# Patient Record
Sex: Male | Born: 1950
Health system: Southern US, Community
[De-identification: ages and names within clinical notes are randomized; demographics above are authoritative.]

## PROBLEM LIST (undated history)

## (undated) DIAGNOSIS — E785 Hyperlipidemia, unspecified: Secondary | ICD-10-CM

## (undated) DIAGNOSIS — H9190 Unspecified hearing loss, unspecified ear: Secondary | ICD-10-CM

## (undated) DIAGNOSIS — F32A Depression, unspecified: Secondary | ICD-10-CM

## (undated) DIAGNOSIS — I219 Acute myocardial infarction, unspecified: Secondary | ICD-10-CM

## (undated) DIAGNOSIS — C801 Malignant (primary) neoplasm, unspecified: Secondary | ICD-10-CM

## (undated) DIAGNOSIS — I10 Essential (primary) hypertension: Secondary | ICD-10-CM

## (undated) DIAGNOSIS — E119 Type 2 diabetes mellitus without complications: Secondary | ICD-10-CM

## (undated) DIAGNOSIS — G4730 Sleep apnea, unspecified: Secondary | ICD-10-CM

## (undated) HISTORY — PX: KNEE ARTHROSCOPY: SUR90

## (undated) HISTORY — DX: Type 2 diabetes mellitus without complications: E11.9

## (undated) HISTORY — PX: ANKLE FRACTURE SURGERY: SHX122

## (undated) HISTORY — DX: Essential (primary) hypertension: I10

## (undated) HISTORY — PX: OTHER SURGICAL HISTORY: SHX169

## (undated) HISTORY — PX: GASTRIC BYPASS: SHX52

## (undated) HISTORY — DX: Hyperlipidemia, unspecified: E78.5

## (undated) HISTORY — DX: Malignant (primary) neoplasm, unspecified: C80.1

## (undated) HISTORY — PX: CARPAL TUNNEL RELEASE: SHX101

## (undated) HISTORY — PX: ELBOW ARTHROSCOPY: SUR87

---

## 2015-12-27 ENCOUNTER — Other Ambulatory Visit: Payer: Self-pay | Admitting: Family Medicine

## 2015-12-27 DIAGNOSIS — M79652 Pain in left thigh: Secondary | ICD-10-CM

## 2015-12-27 DIAGNOSIS — M25562 Pain in left knee: Secondary | ICD-10-CM

## 2016-01-05 ENCOUNTER — Ambulatory Visit
Admission: RE | Admit: 2016-01-05 | Discharge: 2016-01-05 | Disposition: A | Discharge status: Home / Self Care | Payer: Medicare Other | Source: Ambulatory Visit | Attending: Family Medicine | Admitting: Family Medicine

## 2016-01-05 DIAGNOSIS — M79652 Pain in left thigh: Secondary | ICD-10-CM

## 2016-01-05 DIAGNOSIS — M25562 Pain in left knee: Secondary | ICD-10-CM

## 2019-09-29 LAB — BASIC METABOLIC PANEL
BUN: 14 (ref 4–21)
Creatinine: 0.8 (ref 0.6–1.3)

## 2019-09-29 LAB — MICROALBUMIN, URINE: Microalb, Ur: 3.6

## 2019-09-29 LAB — HEMOGLOBIN A1C: Hemoglobin A1C: 10.9

## 2019-09-30 LAB — LIPID PANEL
Cholesterol: 206 — AB (ref 0–200)
HDL: 40 (ref 35–70)
LDL Cholesterol: 126
Triglycerides: 198 — AB (ref 40–160)

## 2019-09-30 LAB — HEMOGLOBIN A1C: Hemoglobin A1C: 10.9

## 2020-01-04 ENCOUNTER — Other Ambulatory Visit: Payer: Self-pay

## 2020-01-04 ENCOUNTER — Encounter: Payer: Self-pay | Admitting: "Endocrinology

## 2020-01-04 ENCOUNTER — Ambulatory Visit: Payer: Medicare PPO | Admitting: "Endocrinology

## 2020-01-04 VITALS — BP 150/88 | HR 56 | Ht 73.0 in | Wt 239.6 lb

## 2020-01-04 DIAGNOSIS — E782 Mixed hyperlipidemia: Secondary | ICD-10-CM

## 2020-01-04 DIAGNOSIS — E1165 Type 2 diabetes mellitus with hyperglycemia: Secondary | ICD-10-CM | POA: Diagnosis not present

## 2020-01-04 DIAGNOSIS — I10 Essential (primary) hypertension: Secondary | ICD-10-CM

## 2020-01-04 LAB — POCT GLYCOSYLATED HEMOGLOBIN (HGB A1C): Hemoglobin A1C: 8.4 % — AB (ref 4.0–5.6)

## 2020-01-04 MED ORDER — RELION BLOOD GLUCOSE TEST VI STRP: ORAL_STRIP | 2 refills | Status: AC

## 2020-01-04 MED ORDER — INSULIN NPH ISOPHANE & REGULAR (70-30) 100 UNIT/ML ~~LOC~~ SUSP: 20.0000 [IU] | Freq: Two times a day (BID) | SUBCUTANEOUS | 1 refills | Status: AC

## 2020-01-04 MED ORDER — METFORMIN HCL ER 500 MG PO TB24: 500.0000 mg | ORAL_TABLET | Freq: Every day | ORAL | 1 refills | Status: DC

## 2020-01-04 MED ORDER — ATORVASTATIN CALCIUM 20 MG PO TABS: 20.0000 mg | ORAL_TABLET | Freq: Every day | ORAL | 1 refills | Status: DC

## 2020-01-04 NOTE — Progress Notes (Signed)
Endocrinology Consult Note       01/04/2020, 4:47 PM   Subjective:    Patient ID: Brian Carpenter, male    DOB: April 02, 1951.  Brian Carpenter is being seen in consultation for management of currently uncontrolled symptomatic diabetes requested by  Emelda Fear, DO.   Past Medical History:  Diagnosis Date  . Cancer ([place])   . Diabetes mellitus, type II ([place])   . Hyperlipidemia   . Hypertension     Past Surgical History:  Procedure Laterality Date  . CARPAL TUNNEL RELEASE    . ELBOW ARTHROSCOPY    . fibrous sarcoma    . GASTRIC BYPASS    . KNEE ARTHROSCOPY      Social History   Socioeconomic History  . Marital status: Married    Spouse name: Not on file  . Number of children: Not on file  . Years of education: Not on file  . Highest education level: Not on file  Occupational History  . Not on file  Tobacco Use  . Smoking status: Never Smoker  Vaping Use  . Vaping Use: Never used  Substance and Sexual Activity  . Alcohol use: Yes  . Drug use: Never  . Sexual activity: Not on file  Other Topics Concern  . Not on file  Social History Narrative  . Not on file   Social Determinants of Health   Financial Resource Strain:   . Difficulty of Paying Living Expenses:   Food Insecurity:   . Worried About Charity fundraiser in the Last Year:   . Arboriculturist in the Last Year:   Transportation Needs:   . Film/video editor (Medical):   Marland Kitchen Lack of Transportation (Non-Medical):   Physical Activity:   . Days of Exercise per Week:   . Minutes of Exercise per Session:   Stress:   . Feeling of Stress :   Social Connections:   . Frequency of Communication with Friends and Family:   . Frequency of Social Gatherings with Friends and Family:   . Attends Religious Services:   . Active Member of Clubs or Organizations:   . Attends Archivist Meetings:   Marland Kitchen Marital Status:      Family History  Problem Relation Age of Onset  . Diabetes Mother   . Hypertension Mother   . Hyperlipidemia Mother   . Cancer Mother   . Cancer Father     Outpatient Encounter Medications as of 01/04/2020  Medication Sig  . Cholecalciferol (VITAMIN D3) 50 MCG (2000 UT) TABS Take 1 tablet by mouth daily.  . Dulaglutide (TRULICITY) 1.5 [place]/7.0WC SOPN Inject 0.5 mLs into the skin once a week.  . insulin NPH-regular Human (70-30) 100 UNIT/ML injection Inject 20 Units into the skin 2 (two) times daily.  . Multiple Vitamins-Minerals (MENS MULTIVITAMIN PO) Take 1 tablet by mouth daily.  . [DISCONTINUED] insulin glargine (LANTUS SOLOSTAR) 100 UNIT/ML Solostar Pen Inject 20 Units into the skin daily.  . [DISCONTINUED] insulin NPH-regular Human (70-30) 100 UNIT/ML injection Inject 20 Units into the skin 2 (two) times daily.  Marland Kitchen atorvastatin (LIPITOR) 20 MG tablet  Take 1 tablet (20 mg total) by mouth daily.  . cetirizine (ZYRTEC) 10 MG tablet Take 1 tablet by mouth daily.  Marland Kitchen glucose blood (RELION GLUCOSE TEST STRIPS) test strip Use as instructed  . lisinopril (ZESTRIL) 20 MG tablet Take 1 tablet by mouth daily.  . metFORMIN (GLUCOPHAGE XR) 500 MG 24 hr tablet Take 1 tablet (500 mg total) by mouth daily with breakfast.  . propranolol (INDERAL) 40 MG tablet Take 1 tablet by mouth daily.  . sertraline (ZOLOFT) 100 MG tablet Take 1 tablet by mouth daily.  . traZODone (DESYREL) 50 MG tablet Take 1 tablet by mouth at bedtime.   No facility-administered encounter medications on file as of 01/04/2020.    ALLERGIES: No Known Allergies  VACCINATION STATUS:  There is no immunization history on file for this patient.  Diabetes He presents for his initial diabetic visit. He has type 2 diabetes mellitus. Onset time: He was diagnosed at approximate age of 86 years. His disease course has been improving. There are no hypoglycemic associated symptoms. Pertinent negatives for hypoglycemia include no  confusion, headaches, pallor or seizures. Associated symptoms include polydipsia and polyuria. Pertinent negatives for diabetes include no chest pain, no fatigue, no polyphagia and no weakness. There are no hypoglycemic complications. Symptoms are improving. Diabetic complications include peripheral neuropathy and PVD. Risk factors for coronary artery disease include diabetes mellitus, dyslipidemia, male sex, sedentary lifestyle, family history and obesity. Current diabetic treatments: Is currently on Novolin 70/30 20 units twice daily, His weight is fluctuating minimally (He has medical history of morbid obesity, weight up to 312 pounds before it gastric bypass in May 2018.). He is following a generally unhealthy diet. When asked about meal planning, he reported none. He has not had a previous visit with a dietitian. He participates in exercise intermittently. His home blood glucose trend is fluctuating minimally. (He did not bring any logs nor meter to review.  His point-of-care A1c was 8.4%, improving from 10.9%.  He denies hypoglycemia.) An ACE inhibitor/angiotensin II receptor blocker is being taken. He does not see a podiatrist.Eye exam is current.  Hyperlipidemia This is a chronic problem. The problem is uncontrolled. Exacerbating diseases include diabetes and obesity. He has no history of chronic renal disease. Pertinent negatives include no chest pain, myalgias or shortness of breath. Risk factors for coronary artery disease include diabetes mellitus, dyslipidemia, hypertension, male sex, a sedentary lifestyle, obesity and family history.  Hypertension This is a chronic problem. Pertinent negatives include no chest pain, headaches, neck pain, palpitations or shortness of breath. Risk factors for coronary artery disease include dyslipidemia, diabetes mellitus, male gender, obesity, family history and sedentary lifestyle. Hypertensive end-organ damage includes PVD. There is no history of chronic renal  disease.     Review of Systems  Constitutional: Negative for chills, fatigue, fever and unexpected weight change.  HENT: Negative for dental problem, mouth sores and trouble swallowing.   Eyes: Negative for visual disturbance.  Respiratory: Negative for cough, choking, chest tightness, shortness of breath and wheezing.   Cardiovascular: Negative for chest pain, palpitations and leg swelling.  Gastrointestinal: Negative for abdominal distention, abdominal pain, constipation, diarrhea, nausea and vomiting.  Endocrine: Positive for polydipsia and polyuria. Negative for polyphagia.  Genitourinary: Negative for dysuria, flank pain, hematuria and urgency.  Musculoskeletal: Negative for back pain, gait problem, myalgias and neck pain.  Skin: Negative for pallor, rash and wound.  Neurological: Negative for seizures, syncope, weakness, numbness and headaches.  Psychiatric/Behavioral: Negative for confusion and dysphoric mood.  Objective:    Vitals with BMI 01/04/2020  Height 6\' 1"   Weight 239 lbs 10 oz  BMI 58.85  Systolic 027  Diastolic 88  Pulse 56    BP (!) 150/88   Pulse (!) 56   Ht 6\' 1"  (1.854 m)   Wt 239 lb 9.6 oz (108.7 kg)   BMI 31.61 kg/m   Wt Readings from Last 3 Encounters:  01/04/20 239 lb 9.6 oz (108.7 kg)     Physical Exam Constitutional:      General: He is not in acute distress.    Appearance: He is well-developed.  HENT:     Head: Normocephalic and atraumatic.  Neck:     Thyroid: No thyromegaly.     Trachea: No tracheal deviation.  Cardiovascular:     Rate and Rhythm: Normal rate.     Pulses:          Dorsalis pedis pulses are 1+ on the right side and 1+ on the left side.       Posterior tibial pulses are 1+ on the right side and 1+ on the left side.     Heart sounds: Normal heart sounds, S1 normal and S2 normal. No murmur heard.  No gallop.   Pulmonary:     Effort: Pulmonary effort is normal. No respiratory distress.     Breath sounds: No wheezing.   Abdominal:     General: There is no distension.     Tenderness: There is no abdominal tenderness. There is no guarding.  Musculoskeletal:     Right shoulder: No swelling or deformity.     Cervical back: Normal range of motion and neck supple.     Comments: Bilateral flat feet, diminished monofilament test, diminished dorsalis pedis and posterior tibial arterial pulses.  Skin:    General: Skin is warm and dry.     Findings: No rash.     Nails: There is no clubbing.  Neurological:     Mental Status: He is alert and oriented to person, place, and time.     Cranial Nerves: No cranial nerve deficit.     Sensory: No sensory deficit.     Gait: Gait normal.     Deep Tendon Reflexes: Reflexes are normal and symmetric.  Psychiatric:        Speech: Speech normal.        Behavior: Behavior normal. Behavior is cooperative.        Thought Content: Thought content normal.        Judgment: Judgment normal.       Diabetic Labs (most recent): Lab Results  Component Value Date   HGBA1C 8.4 (A) 01/04/2020   HGBA1C 10.9 09/30/2019   HGBA1C 10.9 09/29/2019     Lipid Panel ( most recent) Lipid Panel     Component Value Date/Time   CHOL 206 (A) 09/30/2019 0000   TRIG 198 (A) 09/30/2019 0000   HDL 40 09/30/2019 0000   LDLCALC 126 09/30/2019 0000   CMP Latest Ref Rng & Units 09/29/2019  BUN 4 - 21 14  Creatinine 0.6 - 1.3 0.8      Assessment & Plan:   1. Type 2 diabetes mellitus with hyperglycemia, without long-term current use of insulin (HCC)  - Brian Carpenter has currently uncontrolled symptomatic type 2 DM since  69 years of age,  with most recent A1c of 8.4 %.  This is an improvement from 10.9% in April 2021.  Recent labs reviewed. - I had a long discussion  with him about the progressive nature of diabetes and the pathology behind its complications. -his diabetes is complicated by peripheral arterial disease, peripheral neuropathy, obesity/sedentary life and he remains at a  high risk for more acute and chronic complications which include CAD, CVA, CKD, retinopathy, and neuropathy. These are all discussed in detail with him.  - I have counseled him on diet  and weight management  by adopting a carbohydrate restricted/protein rich diet. Patient is encouraged to switch to  unprocessed or minimally processed     complex starch and increased protein intake (animal or plant source), fruits, and vegetables. -  he is advised to stick to a routine mealtimes to eat 3 meals  a day and avoid unnecessary snacks ( to snack only to correct hypoglycemia).   - he admits that there is a room for improvement in his food and drink choices. - Suggestion is made for him to avoid simple carbohydrates  from his diet including Cakes, Sweet Desserts, Ice Cream, Soda (diet and regular), Sweet Tea, Candies, Chips, Cookies, Store Bought Juices, Alcohol in Excess of  1-2 drinks a day, Artificial Sweeteners,  Coffee Creamer, and "Sugar-free" Products. This will help patient to have more stable blood glucose profile and potentially avoid unintended weight gain.  - he will be scheduled with Jearld Fenton, RDN, CDE for diabetes education.  - I have approached him with the following individualized plan to manage  his diabetes and patient agrees:   - he will continue to need insulin treatment in order for him to achieve and maintain control of diabetes to target.  Due to concerns from hospital Lantus, she would be allowed to remain on Novolin 70/30 3 with 20 units twice daily with breakfast and supper,  associated with strict monitoring of glucose 4 times a day-before meals and at bedtime. - he is warned not to take insulin without proper monitoring per orders. - Adjustment parameters are given to him for hypo and hyperglycemia in writing. - he is encouraged to call clinic for blood glucose levels less than 70 or above 300 mg /dl. -He will benefit from low-dose Metformin.  I discussed and added Metformin  500 mg ER daily at breakfast. -He will continue to benefit from Trulicity if he has access and proper coverage-0.5 mg subcutaneously weekly.   - Specific targets for  A1c;  LDL, HDL,  and Triglycerides were discussed with the patient.  2) Blood Pressure /Hypertension:  his blood pressure is  not controlled to target.   he is advised to continue his current medications including lisinopril 20 mg p.o. daily with breakfast . 3) Lipids/Hyperlipidemia:   Review of his recent lipid panel showed un controlled  LDL at 126 .  Will need statin treatment.  I discussed and added atorvastatin 20 mg p.o. nightly.   Side effects and precautions discussed with him.  4)  Weight/Diet:  Body mass index is 31.61 kg/m.  -   clearly complicating his diabetes care.  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 he is  a candidate for weight loss. I discussed with him the fact that loss of 5 - 10% of his  current body weight will have the most impact on his diabetes management.  Exercise, and detailed carbohydrates information provided  -  detailed on discharge instructions.  5) Chronic Care/Health Maintenance:  -he  is on ACEI/ARB and Statin medications and  is encouraged to initiate and continue to follow  up with Ophthalmology, Dentist,  Podiatrist at least yearly or according to recommendations, and advised to   stay away from smoking. I have recommended yearly flu vaccine and pneumonia vaccine at least every 5 years; moderate intensity exercise for up to 150 minutes weekly; and  sleep for at least 7 hours a day.  - he is  advised to maintain close follow up with Emelda Fear, DO for primary care needs, as well as his other providers for optimal and coordinated care.   - Time spent in this patient care: 60 min, of which > 50% was spent in  counseling  him about his currently uncontrolled type 2 diabetes, hyperlipidemia, hypertension and the rest reviewing his blood glucose  logs , discussing his hypoglycemia and hyperglycemia episodes, reviewing his current and  previous labs / studies  ( including abstraction from other facilities) and medications  doses and developing a  long term treatment plan based on the latest standards of care/ guidelines; and documenting his care.    Please refer to Patient Instructions for Blood Glucose Monitoring and Insulin/Medications Dosing Guide"  in media tab for additional information. Please  also refer to " Patient Self Inventory" in the Media  tab for reviewed elements of pertinent patient history.  Carlyon Prows participated in the discussions, expressed understanding, and voiced agreement with the above plans.  All questions were answered to his satisfaction. he is encouraged to contact clinic should he have any questions or concerns prior to his return visit.   Follow up plan: - Return in about 4 weeks (around 02/01/2020) for F/U with Meter and Logs Only - no Labs.  Glade Lloyd, MD [facility] [address] [place], [place] [address] Phone: [phone]  Fax: [phone]    01/04/2020, 4:47 PM  This note was partially dictated with voice recognition software. Similar sounding words can be transcribed inadequately or may not  be corrected upon review.

## 2020-01-04 NOTE — Patient Instructions (Signed)

## 2020-02-01 ENCOUNTER — Ambulatory Visit: Payer: Medicare PPO | Admitting: "Endocrinology

## 2020-08-08 ENCOUNTER — Inpatient Hospital Stay (HOSPITAL_COMMUNITY)
Admission: AD | Admit: 2020-08-08 | Discharge: 2020-08-15 | DRG: 311 | Disposition: A | Discharge status: Home / Self Care | Payer: Medicare PPO | Source: Other Acute Inpatient Hospital | Attending: Cardiovascular Disease | Admitting: Cardiovascular Disease

## 2020-08-08 ENCOUNTER — Inpatient Hospital Stay (HOSPITAL_COMMUNITY): Payer: Medicare PPO

## 2020-08-08 DIAGNOSIS — E876 Hypokalemia: Secondary | ICD-10-CM | POA: Diagnosis not present

## 2020-08-08 DIAGNOSIS — U071 COVID-19: Secondary | ICD-10-CM | POA: Clinically undetermined

## 2020-08-08 DIAGNOSIS — Z01810 Encounter for preprocedural cardiovascular examination: Secondary | ICD-10-CM | POA: Diagnosis not present

## 2020-08-08 DIAGNOSIS — E1165 Type 2 diabetes mellitus with hyperglycemia: Secondary | ICD-10-CM | POA: Diagnosis present

## 2020-08-08 DIAGNOSIS — I25110 Atherosclerotic heart disease of native coronary artery with unstable angina pectoris: Secondary | ICD-10-CM | POA: Diagnosis not present

## 2020-08-08 DIAGNOSIS — Z6831 Body mass index (BMI) 31.0-31.9, adult: Secondary | ICD-10-CM

## 2020-08-08 DIAGNOSIS — Z794 Long term (current) use of insulin: Secondary | ICD-10-CM | POA: Diagnosis not present

## 2020-08-08 DIAGNOSIS — I249 Acute ischemic heart disease, unspecified: Principal | ICD-10-CM | POA: Diagnosis present

## 2020-08-08 DIAGNOSIS — I255 Ischemic cardiomyopathy: Secondary | ICD-10-CM | POA: Diagnosis present

## 2020-08-08 DIAGNOSIS — Z7984 Long term (current) use of oral hypoglycemic drugs: Secondary | ICD-10-CM | POA: Diagnosis not present

## 2020-08-08 DIAGNOSIS — Z833 Family history of diabetes mellitus: Secondary | ICD-10-CM

## 2020-08-08 DIAGNOSIS — Z531 Procedure and treatment not carried out because of patient's decision for reasons of belief and group pressure: Secondary | ICD-10-CM | POA: Diagnosis present

## 2020-08-08 DIAGNOSIS — I2510 Atherosclerotic heart disease of native coronary artery without angina pectoris: Secondary | ICD-10-CM | POA: Diagnosis present

## 2020-08-08 DIAGNOSIS — Z9884 Bariatric surgery status: Secondary | ICD-10-CM

## 2020-08-08 DIAGNOSIS — Z83438 Family history of other disorder of lipoprotein metabolism and other lipidemia: Secondary | ICD-10-CM | POA: Diagnosis not present

## 2020-08-08 DIAGNOSIS — Z8249 Family history of ischemic heart disease and other diseases of the circulatory system: Secondary | ICD-10-CM

## 2020-08-08 DIAGNOSIS — I459 Conduction disorder, unspecified: Secondary | ICD-10-CM | POA: Diagnosis present

## 2020-08-08 DIAGNOSIS — Z7982 Long term (current) use of aspirin: Secondary | ICD-10-CM

## 2020-08-08 DIAGNOSIS — E782 Mixed hyperlipidemia: Secondary | ICD-10-CM | POA: Diagnosis present

## 2020-08-08 DIAGNOSIS — I10 Essential (primary) hypertension: Secondary | ICD-10-CM | POA: Diagnosis present

## 2020-08-08 DIAGNOSIS — Z79899 Other long term (current) drug therapy: Secondary | ICD-10-CM | POA: Diagnosis not present

## 2020-08-08 DIAGNOSIS — E669 Obesity, unspecified: Secondary | ICD-10-CM | POA: Diagnosis present

## 2020-08-08 HISTORY — DX: COVID-19: U07.1

## 2020-08-08 LAB — HEPARIN LEVEL (UNFRACTIONATED): Heparin Unfractionated: 0.1 IU/mL — ABNORMAL LOW (ref 0.30–0.70)

## 2020-08-08 LAB — HIV ANTIBODY (ROUTINE TESTING W REFLEX): HIV Screen 4th Generation wRfx: NONREACTIVE

## 2020-08-08 LAB — GLUCOSE, CAPILLARY: Glucose-Capillary: 227 mg/dL — ABNORMAL HIGH (ref 70–99)

## 2020-08-08 MED ORDER — HEPARIN (PORCINE) 25000 UT/250ML-% IV SOLN
1450.0000 [IU]/h | INTRAVENOUS | Status: DC
  Administered 2020-08-08: 1400 [IU]/h via INTRAVENOUS
  Administered 2020-08-12: 1550 [IU]/h via INTRAVENOUS
  Administered 2020-08-13: 1450 [IU]/h via INTRAVENOUS
  Filled 2020-08-08 (×7): qty 250

## 2020-08-08 MED ORDER — ASPIRIN EC 81 MG PO TBEC
81.0000 mg | DELAYED_RELEASE_TABLET | Freq: Every day | ORAL | Status: DC
  Administered 2020-08-09 – 2020-08-15 (×7): 81 mg via ORAL
  Filled 2020-08-08 (×7): qty 1

## 2020-08-08 MED ORDER — ATORVASTATIN CALCIUM 80 MG PO TABS
80.0000 mg | ORAL_TABLET | Freq: Every day | ORAL | Status: DC
  Administered 2020-08-08 – 2020-08-15 (×8): 80 mg via ORAL
  Filled 2020-08-08 (×8): qty 1

## 2020-08-08 MED ORDER — PROPRANOLOL HCL 40 MG PO TABS
40.0000 mg | ORAL_TABLET | Freq: Every day | ORAL | Status: DC
  Administered 2020-08-10 – 2020-08-12 (×2): 40 mg via ORAL
  Filled 2020-08-08 (×4): qty 1

## 2020-08-08 MED ORDER — ONDANSETRON HCL 4 MG/2ML IJ SOLN: 4.0000 mg | Freq: Four times a day (QID) | INTRAMUSCULAR | Status: DC | PRN

## 2020-08-08 MED ORDER — NITROGLYCERIN 0.4 MG SL SUBL: 0.4000 mg | SUBLINGUAL_TABLET | SUBLINGUAL | Status: DC | PRN

## 2020-08-08 MED ORDER — ACETAMINOPHEN 325 MG PO TABS: 650.0000 mg | ORAL_TABLET | ORAL | Status: DC | PRN

## 2020-08-08 MED ORDER — ADULT MULTIVITAMIN W/MINERALS CH
1.0000 | ORAL_TABLET | Freq: Every day | ORAL | Status: DC
  Administered 2020-08-09 – 2020-08-15 (×7): 1 via ORAL
  Filled 2020-08-08 (×7): qty 1

## 2020-08-08 MED ORDER — ASPIRIN 300 MG RE SUPP: 300.0000 mg | RECTAL | Status: AC

## 2020-08-08 MED ORDER — AMLODIPINE BESYLATE 5 MG PO TABS
5.0000 mg | ORAL_TABLET | Freq: Every day | ORAL | Status: DC
  Administered 2020-08-08 – 2020-08-12 (×5): 5 mg via ORAL
  Filled 2020-08-08 (×6): qty 1

## 2020-08-08 MED ORDER — ASPIRIN 81 MG PO CHEW
324.0000 mg | CHEWABLE_TABLET | ORAL | Status: AC
  Administered 2020-08-08: 324 mg via ORAL
  Filled 2020-08-08: qty 4

## 2020-08-08 MED ORDER — INSULIN ASPART PROT & ASPART (70-30 MIX) 100 UNIT/ML ~~LOC~~ SUSP
20.0000 [IU] | Freq: Two times a day (BID) | SUBCUTANEOUS | Status: DC
  Administered 2020-08-09 – 2020-08-13 (×10): 20 [IU] via SUBCUTANEOUS
  Filled 2020-08-08: qty 10

## 2020-08-08 MED ORDER — TRAZODONE HCL 50 MG PO TABS
50.0000 mg | ORAL_TABLET | Freq: Every day | ORAL | Status: DC
  Administered 2020-08-08 – 2020-08-14 (×7): 50 mg via ORAL
  Filled 2020-08-08 (×7): qty 1

## 2020-08-08 MED ORDER — INSULIN ASPART 100 UNIT/ML ~~LOC~~ SOLN
0.0000 [IU] | Freq: Three times a day (TID) | SUBCUTANEOUS | Status: DC
  Administered 2020-08-09: 15 [IU] via SUBCUTANEOUS
  Administered 2020-08-09: 2 [IU] via SUBCUTANEOUS
  Administered 2020-08-10: 5 [IU] via SUBCUTANEOUS
  Administered 2020-08-10 – 2020-08-11 (×4): 3 [IU] via SUBCUTANEOUS
  Administered 2020-08-12 – 2020-08-14 (×3): 2 [IU] via SUBCUTANEOUS
  Administered 2020-08-14 – 2020-08-15 (×2): 3 [IU] via SUBCUTANEOUS

## 2020-08-08 MED ORDER — SERTRALINE HCL 100 MG PO TABS
100.0000 mg | ORAL_TABLET | Freq: Every day | ORAL | Status: DC
  Administered 2020-08-08 – 2020-08-15 (×8): 100 mg via ORAL
  Filled 2020-08-08 (×8): qty 1

## 2020-08-08 MED ORDER — LISINOPRIL 10 MG PO TABS
40.0000 mg | ORAL_TABLET | Freq: Every day | ORAL | Status: DC
  Administered 2020-08-08 – 2020-08-15 (×8): 40 mg via ORAL
  Filled 2020-08-08 (×9): qty 4

## 2020-08-08 NOTE — Progress Notes (Addendum)
@  2152, Dr. Doylene Canard text-paged regarding pt's SBPs persisting in 180s-190s.  @2231  MD called after above text-page went unanswered. MD endorsed he didn't receive a page but conveyed to give a dose NOW of pt's Lisinopril but to hold Propranolol due to bradycardia. Will administer and continue to follow.

## 2020-08-08 NOTE — Progress Notes (Addendum)
ANTICOAGULATION CONSULT NOTE - Initial Consult  Pharmacy Consult for heparin Indication: chest pain/ACS  No Known Allergies  Patient Measurements: Height: 6\' 1"  (185.4 cm) Weight: 109.9 kg (242 lb 4.6 oz) IBW/kg (Calculated) : 79.9 Heparin Dosing Weight: 110kg  Vital Signs: Temp: 97.9 F (36.6 C) (03/09 1930) Temp Source: Oral (03/09 1930) BP: 187/100 (03/09 1930) Pulse Rate: 58 (03/09 1835)  Labs: No results for input(s): HGB, HCT, PLT, APTT, LABPROT, INR, HEPARINUNFRC, HEPRLOWMOCWT, CREATININE, CKTOTAL, CKMB, TROPONINIHS in the last 72 hours.  CrCl cannot be calculated (Patient's most recent lab result is older than the maximum 21 days allowed.).   Medical History: Past Medical History:  Diagnosis Date  . Cancer ([place])   . Diabetes mellitus, type II ([place])   . Hyperlipidemia   . Hypertension     Medications:  Medications Prior to Admission  Medication Sig Dispense Refill Last Dose  . atorvastatin (LIPITOR) 20 MG tablet Take 1 tablet (20 mg total) by mouth daily. 90 tablet 1   . cetirizine (ZYRTEC) 10 MG tablet Take 1 tablet by mouth daily.     . Cholecalciferol (VITAMIN D3) 50 MCG (2000 UT) TABS Take 1 tablet by mouth daily.     . Dulaglutide (TRULICITY) 1.5 GY/1.7CB SOPN Inject 0.5 mLs into the skin once a week.     Marland Kitchen glucose blood (RELION GLUCOSE TEST STRIPS) test strip Use as instructed 100 each 2   . insulin NPH-regular Human (70-30) 100 UNIT/ML injection Inject 20 Units into the skin 2 (two) times daily. 30 mL 1   . lisinopril (ZESTRIL) 20 MG tablet Take 1 tablet by mouth daily.     . metFORMIN (GLUCOPHAGE XR) 500 MG 24 hr tablet Take 1 tablet (500 mg total) by mouth daily with breakfast. 90 tablet 1   . Multiple Vitamins-Minerals (MENS MULTIVITAMIN PO) Take 1 tablet by mouth daily.     . propranolol (INDERAL) 40 MG tablet Take 1 tablet by mouth daily.     . sertraline (ZOLOFT) 100 MG tablet Take 1 tablet by mouth daily.     . traZODone (DESYREL) 50 MG tablet  Take 1 tablet by mouth at bedtime.      Scheduled:  . amLODipine  5 mg Oral Daily  . aspirin  324 mg Oral NOW   Or  . aspirin  300 mg Rectal NOW  . [START ON 08/09/2020] aspirin EC  81 mg Oral Daily  . atorvastatin  80 mg Oral Daily  . [START ON 08/09/2020] insulin aspart protamine- aspart  20 Units Subcutaneous BID WC  . lisinopril  20 mg Oral Daily  . [START ON 08/09/2020] Mens Multivitamin  1 tablet Oral Daily  . propranolol  40 mg Oral Daily  . sertraline  100 mg Oral Daily  . traZODone  50 mg Oral QHS   Infusions:    Assessment: Pt was transferred from [facility] for CP. He is s/p cath>>3VD. He was started on IV heparin there post cath around 12PM. He was transferred for CABG. Heparin has been running at 1000 units/hr.  He is not on anticoagulation prior to admission.   Goal of Therapy:  Heparin level 0.3-0.7 units/ml Monitor platelets by anticoagulation protocol: Yes   Plan:   Continue heparin 1000 units/hr Check heparin level now  Onnie Boer, PharmD, BCIDP, AAHIVP, CPP Infectious Disease Pharmacist 08/08/2020 8:28 PM  Addendum  HL came back at 0.1  Increase rate to 1400 units/hr Check level in AM  Onnie Boer, PharmD, BCIDP, AAHIVP,  CPP Infectious Disease Pharmacist 08/08/2020 8:46 PM

## 2020-08-08 NOTE — H&P (Signed)
Referring Physician: Sabra Heck, MD  Brian Carpenter is an 70 y.o. male.                       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 Patient has exertional chest pain for several months and his recent NM myocardial perfusion stress test was abnormal.   Past Medical History:  Diagnosis Date  . Cancer ([place])   . Diabetes mellitus, type II ([place])   . Hyperlipidemia   . Hypertension       Past Surgical History:  Procedure Laterality Date  . CARPAL TUNNEL RELEASE    . ELBOW ARTHROSCOPY    . fibrous sarcoma    . GASTRIC BYPASS    . KNEE ARTHROSCOPY      Family History  Problem Relation Age of Onset  . Diabetes Mother   . Hypertension Mother   . Hyperlipidemia Mother   . Cancer Mother   . Cancer Father    Social History:  reports that he has never smoked. He does not have any smokeless tobacco history on file. He reports current alcohol use. He reports that he does not use drugs.  Allergies: No Known Allergies  Medications Prior to Admission  Medication Sig Dispense Refill  . atorvastatin (LIPITOR) 20 MG tablet Take 1 tablet (20 mg total) by mouth daily. 90 tablet 1  . cetirizine (ZYRTEC) 10 MG tablet Take 1 tablet by mouth daily.    . Cholecalciferol (VITAMIN D3) 50 MCG (2000 UT) TABS Take 1 tablet by mouth daily.    . Dulaglutide (TRULICITY) 1.5 LZ/7.6BH SOPN Inject 0.5 mLs into the skin once a week.    Marland Kitchen glucose blood (RELION GLUCOSE TEST STRIPS) test strip Use as instructed 100 each 2  . insulin NPH-regular Human (70-30) 100 UNIT/ML injection Inject 20 Units into the skin 2 (two) times daily. 30 mL 1  . lisinopril (ZESTRIL) 20 MG tablet Take 1 tablet by mouth daily.    . metFORMIN (GLUCOPHAGE XR) 500 MG 24 hr tablet Take 1 tablet (500 mg  total) by mouth daily with breakfast. 90 tablet 1  . Multiple Vitamins-Minerals (MENS MULTIVITAMIN PO) Take 1 tablet by mouth daily.    . propranolol (INDERAL) 40 MG tablet Take 1 tablet by mouth daily.    . sertraline (ZOLOFT) 100 MG tablet Take 1 tablet by mouth daily.    . traZODone (DESYREL) 50 MG tablet Take 1 tablet by mouth at bedtime.      No results found for this or any previous visit (from the past 48 hour(s)). No results found.  Review Of Systems Constitutional: No fever, chills, positive chronic weight gain. Eyes: No vision change, wears glasses. No discharge or pain. Ears: No hearing loss, No tinnitus. Respiratory: No asthma, COPD, pneumonias. Positive shortness of breath. No hemoptysis. Cardiovascular: Positive chest pain, no palpitation, leg edema. Gastrointestinal: No nausea, vomiting, diarrhea, constipation. No GI bleed. No hepatitis. Genitourinary: No dysuria, hematuria, kidney stone. No incontinance. Neurological: No headache, stroke, seizures.  Psychiatry: No psych facility admission for anxiety, depression, suicide. No detox. Skin: No rash. Musculoskeletal: Positive joint pain, no fibromyalgia. No neck pain, back pain. Lymphadenopathy: No lymphadenopathy. Hematology: No anemia or easy bruising.   Blood pressure (!) 187/100, pulse (!) 58, temperature 97.9  F (36.6 C), temperature source Oral, resp. rate 17, height 6\' 1"  (1.854 m), weight 109.9 kg, SpO2 95 %. Body mass index is 31.97 kg/m. General appearance: alert, cooperative, appears stated age and no distress Head: Normocephalic, atraumatic. Eyes: Brown eyes, pink conjunctiva, corneas clear. Neck: No adenopathy, no carotid bruit, no JVD, supple, symmetrical, trachea midline and thyroid not enlarged. Resp: Clear to auscultation bilaterally. Cardio: Regular rate and rhythm, S1, S2 normal, II/VI systolic murmur, no click, rub or gallop GI: Soft, non-tender; bowel sounds normal; no organomegaly. Extremities: No  edema, cyanosis or clubbing. Stable right radial cath site. Skin: Warm and dry.  Neurologic: Alert and oriented X 3, normal strength.  Assessment/Plan Acute coronary syndrome Multivessel, native vessel CAD HTN Hyperlipidemia Type 2 DM, Insulin requiring Obesity  Review cine angiogram with CVTS and interventional cardiologist in AM. Patient has diffuse disease in all three major coronary arteries and branches. If surgery is not optimal then staged stenting of LCx followed by RCA and LAD in future as needed. Start Beta-blocker and calcium channel blocker with aspirin, IV heparin. Hold metformin and continue insulin and ace inhibitor. Antidepressant as needed.  Time spent: Review of old records, Lab, x-rays, EKG, other cardiac tests, examination, discussion with patient over 70 minutes.  Brian Riddle, MD  08/08/2020, 7:54 PM

## 2020-08-08 NOTE — Progress Notes (Signed)
Pt received from [facility]. Chg complete. Telemetry applied. Heparin gtt running at 51ml. BP 200/83 (118). Dr. Doylene Canard made aware of patients arrival  Will continue to monitor.  Clyde Canterbury, RN

## 2020-08-09 ENCOUNTER — Inpatient Hospital Stay (HOSPITAL_COMMUNITY): Payer: Medicare PPO

## 2020-08-09 ENCOUNTER — Other Ambulatory Visit (HOSPITAL_COMMUNITY): Payer: Medicare PPO

## 2020-08-09 DIAGNOSIS — I25110 Atherosclerotic heart disease of native coronary artery with unstable angina pectoris: Secondary | ICD-10-CM

## 2020-08-09 DIAGNOSIS — U071 COVID-19: Secondary | ICD-10-CM

## 2020-08-09 LAB — CBC
HCT: 38.3 % — ABNORMAL LOW (ref 39.0–52.0)
Hemoglobin: 12.8 g/dL — ABNORMAL LOW (ref 13.0–17.0)
MCH: 29 pg (ref 26.0–34.0)
MCHC: 33.4 g/dL (ref 30.0–36.0)
MCV: 86.8 fL (ref 80.0–100.0)
Platelets: 198 10*3/uL (ref 150–400)
RBC: 4.41 MIL/uL (ref 4.22–5.81)
RDW: 13.7 % (ref 11.5–15.5)
WBC: 5.8 10*3/uL (ref 4.0–10.5)
nRBC: 0 % (ref 0.0–0.2)

## 2020-08-09 LAB — GLUCOSE, CAPILLARY
Glucose-Capillary: 362 mg/dL — ABNORMAL HIGH (ref 70–99)
Glucose-Capillary: 78 mg/dL (ref 70–99)
Glucose-Capillary: 180 mg/dL — ABNORMAL HIGH (ref 70–99)
Glucose-Capillary: 122 mg/dL — ABNORMAL HIGH (ref 70–99)

## 2020-08-09 LAB — PROTIME-INR
INR: 1 (ref 0.8–1.2)
Prothrombin Time: 12.9 seconds (ref 11.4–15.2)

## 2020-08-09 LAB — BASIC METABOLIC PANEL
Anion gap: 6 (ref 5–15)
BUN: 13 mg/dL (ref 8–23)
CO2: 27 mmol/L (ref 22–32)
Calcium: 9.2 mg/dL (ref 8.9–10.3)
Chloride: 102 mmol/L (ref 98–111)
Creatinine, Ser: 1 mg/dL (ref 0.61–1.24)
GFR, Estimated: >60 mL/min (ref >60)
Glucose, Bld: 360 mg/dL — ABNORMAL HIGH (ref 70–99)
Potassium: 4.4 mmol/L (ref 3.5–5.1)
Sodium: 135 mmol/L (ref 135–145)

## 2020-08-09 LAB — LIPID PANEL
Cholesterol: 259 mg/dL — ABNORMAL HIGH (ref 0–200)
HDL: 36 mg/dL — ABNORMAL LOW (ref >40)
LDL Cholesterol: 162 mg/dL — ABNORMAL HIGH (ref 0–99)
Total CHOL/HDL Ratio: 7.2 RATIO
Triglycerides: 304 mg/dL — ABNORMAL HIGH (ref <150)
VLDL: 61 mg/dL — ABNORMAL HIGH (ref 0–40)

## 2020-08-09 LAB — SARS CORONAVIRUS 2 (TAT 6-24 HRS): SARS Coronavirus 2: POSITIVE — AB

## 2020-08-09 LAB — ECHOCARDIOGRAM COMPLETE
Area-P 1/2: 6.77 cm2
Height: 73 in
Weight: 3873.04 oz

## 2020-08-09 LAB — HEMOGLOBIN A1C
Hgb A1c MFr Bld: 12 % — ABNORMAL HIGH (ref 4.8–5.6)
Mean Plasma Glucose: 297.7 mg/dL

## 2020-08-09 LAB — HEPARIN LEVEL (UNFRACTIONATED): Heparin Unfractionated: 0.28 IU/mL — ABNORMAL LOW (ref 0.30–0.70)

## 2020-08-09 MED ORDER — HYDRALAZINE HCL 25 MG PO TABS
25.0000 mg | ORAL_TABLET | Freq: Four times a day (QID) | ORAL | Status: DC | PRN
  Administered 2020-08-09 – 2020-08-14 (×7): 25 mg via ORAL
  Filled 2020-08-09 (×8): qty 1

## 2020-08-09 MED ORDER — SODIUM CHLORIDE 0.9 % IV SOLN
100.0000 mg | Freq: Every day | INTRAVENOUS | Status: DC
  Filled 2020-08-09: qty 20

## 2020-08-09 MED ORDER — SODIUM CHLORIDE 0.9 % IV SOLN
200.0000 mg | Freq: Once | INTRAVENOUS | Status: AC
  Administered 2020-08-09: 200 mg via INTRAVENOUS
  Filled 2020-08-09: qty 40

## 2020-08-09 MED ORDER — HYDRALAZINE HCL 50 MG PO TABS
50.0000 mg | ORAL_TABLET | Freq: Once | ORAL | Status: AC
  Administered 2020-08-09: 50 mg via ORAL
  Filled 2020-08-09: qty 1

## 2020-08-09 NOTE — Progress Notes (Signed)
  Echocardiogram 2D Echocardiogram has been performed.  Merrie Roof F 08/09/2020, 1:39 PM

## 2020-08-09 NOTE — Progress Notes (Signed)
ANTICOAGULATION CONSULT NOTE  Pharmacy Consult for heparin Indication: CAD  No Known Allergies  Patient Measurements: Height: 6\' 1"  (185.4 cm) Weight: 109.9 kg (242 lb 4.6 oz) IBW/kg (Calculated) : 79.9 Heparin Dosing Weight: 110kg  Vital Signs: Temp: 98.1 F (36.7 C) (03/09 2235) Temp Source: Oral (03/09 2235) BP: 181/82 (03/10 0300) Pulse Rate: 58 (03/09 1835)  Labs: Recent Labs    08/08/20 2009 08/09/20 0128  HGB  --  12.8*  HCT  --  38.3*  PLT  --  198  LABPROT  --  12.9  INR  --  1.0  HEPARINUNFRC 0.10* 0.28*  CREATININE  --  1.00    Estimated Creatinine Clearance: 90.6 mL/min (by C-G formula based on SCr of 1 mg/dL).  Assessment: 70 y.o. male with CAD s/p cath, awaiting possible CABG, for heparin  Goal of Therapy:  Heparin level 0.3-0.7 units/ml Monitor platelets by anticoagulation protocol: Yes   Plan:  Increase Heparin 1550 units/hr  Phillis Knack, PharmD, BCPS

## 2020-08-09 NOTE — Progress Notes (Signed)
Inpatient Diabetes Program Recommendations  AACE/ADA: New Consensus Statement on Inpatient Glycemic Control  Target Ranges:  Prepandial:   less than 140 mg/dL      Peak postprandial:   less than 180 mg/dL (1-2 hours)      Critically ill patients:  140 - 180 mg/dL   Results for SADLER, TESCHNER (MRN [id number]) as of 08/09/2020 07:59  Ref. Range 08/08/2020 21:24 08/09/2020 06:20  Glucose-Capillary Latest Ref Range: 70 - 99 mg/dL 227 (H) 362 (H)  Results for DREYDEN, ROHRMAN (MRN [id number]) as of 08/09/2020 07:59  Ref. Range 01/04/2020 10:17 08/09/2020 01:28  Hemoglobin A1C Latest Ref Range: 4.8 - 5.6 % 8.4 (A) 12.0 (H)   Review of Glycemic Control  Diabetes history: DM2 Outpatient Diabetes medications: 70/30 10-20 units BID with breakfast and supper (dose based on glucose) Current orders for Inpatient glycemic control: 70/30 20 units BID, Novolog 0-15 units TID with meals  Inpatient Diabetes Program Recommendations:    HbgA1C:  A1C 12% on 08/09/20 indicating an average glucose of 297 mg/dl over the past 2-3 months.   Note: Spoke with patient over the phone about diabetes and home regimen for diabetes control. Patient reports being followed by PCP for diabetes management. Noted in chart that patient had initial visit with Dr. Dorris Fetch (Endocrinologist) on 01/04/20 but when asked about follow up patient states that he is not seeing Dr. Dorris Fetch anymore and he is just working with his PCP for DM control. Patient is currently taking 70/30 10-20 units BID with breakfast and supper as an outpatient for diabetes control. Patient reports taking DM medications as prescribed. Patient reports checking glucose 1-2 times per day and he notes his glucose had been running 95-115 mg/dl in the mornings but he had a cold about 3 weeks ago and glucose has been running much higher since that time. Patient denies any recent steroids.   Inquired about prior A1C and patient reports he had his A1C down to 7.4%. Discussed A1C results  (12% on 08/09/20) and explained that current A1C indicates an average glucose of 297 mg/dl over the past 2-3 months. Discussed glucose and A1C goals. Discussed importance of checking CBGs and maintaining good CBG control to prevent long-term and short-term complications. Explained how hyperglycemia leads to damage within blood vessels which lead to the common complications seen with uncontrolled diabetes. Stressed to the patient the importance of improving glycemic control to prevent further complications from uncontrolled diabetes. Discussed impact of nutrition, exercise, stress, sickness, and medications on diabetes control.  Inquired about potential of using continuous glucose monitoring sensor to monitor glucose more frequently and patient states that he tried to get a [employer] but his insurance would not pay for it. Encouraged patient to check glucose more often and be sure to take glucometer to appointments so provider can use the information to make adjustments.  Patient verbalized understanding of information discussed and reports no further questions at this time related to diabetes. Will continue to follow while inpatient.  Thanks, Barnie Alderman, RN, MSN, CDE Diabetes Coordinator Inpatient Diabetes Program [phone] (Team Pager)

## 2020-08-09 NOTE — Progress Notes (Signed)
@  0022, Dr. Doylene Canard notified of pt's positive COVID test. Appropriate precautions ordered and pt informed. Pt presently remains asymptomatic.

## 2020-08-09 NOTE — Consult Note (Signed)
[address]       [place] [address]             [phone]        [facility] [place] Medical Record #[id number] Date of Birth: 1950/12/25  Referring: Sabra Heck [facility]), Doylene Canard Primary Care: Emelda Fear, DO Primary Cardiologist:No primary care provider on file.  Chief Complaint:  CAD  History of Present Illness:      [name] is a 70 yo male with known history of HTN, Hyperlipidemia, Type 2 DM, H/O gastric bypass, and obesity.  The patient had been experiencing exertional chest pain over the past several months.  He had a myocardial perfusion test which was abnormal.  He underwent cardiac catheterization by Dr. Sabra Heck in [place].  This showed a mildly reduced EF of 45% and multivessel CAD.  It was felt he may benefit from coronary bypass grafting procedure and he was transferred to [facility] for further care.  Upon arrival the patient was noted to be COVID positive.  He is asymptomatic and has been vaccinated.  The patient is a Designer, television/film set.  He states that he thinks his pain likely developed back in July.  He states he was doing some work when he developed some chest discomfort.  He initially attributed this to a pulled muscle across his chest.  He states he experienced shortness of breath, decreased energy level, and fatigue as well at that time.  He states more recently the pain has become more severe.  He states his mainly experiences symptoms with exertion, however back in July symptoms would also occur at rest.  The patient denies ever smoking.  He is a diabetic treated with insulin.  He remains physically active and prior to July had no issues getting done what he needed to.  The patient has had multiple orthopedic surgeries.  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  Currently he is pain free on Heparin drip.  Current Activity/ Functional Status: Patient is independent with  mobility/ambulation, transfers, ADL's, IADL's.   Zubrod Score: At the time of surgery this patient's most appropriate activity status/level should be described as: []     0    Normal activity, no symptoms [x]     1    Restricted in physical strenuous activity but ambulatory, able to do out light work []     2    Ambulatory and capable of self care, unable to do work activities, up and about                 more than 50%  Of the time                            []     3    Only limited self care, in bed greater than 50% of waking hours []     4    Completely disabled, no self care, confined to bed or chair []     5    Moribund  Past Medical History:  Diagnosis Date  . Cancer ([place])   . Diabetes mellitus, type II ([place])   . Hyperlipidemia   . Hypertension     Past Surgical History:  Procedure Laterality Date  . CARPAL TUNNEL RELEASE    . ELBOW ARTHROSCOPY    . fibrous sarcoma    . GASTRIC BYPASS    .  KNEE ARTHROSCOPY      Social History   Tobacco Use  Smoking Status Never Smoker  Smokeless Tobacco Not on file    Social History   Substance and Sexual Activity  Alcohol Use Yes     No Known Allergies  Current Facility-Administered Medications  Medication Dose Route Frequency Provider Last Rate Last Admin  . acetaminophen (TYLENOL) tablet 650 mg  650 mg Oral Q4H PRN Dixie Dials, MD      . amLODipine (NORVASC) tablet 5 mg  5 mg Oral Daily Dixie Dials, MD   5 mg at 08/09/20 4098  . aspirin EC tablet 81 mg  81 mg Oral Daily Dixie Dials, MD   81 mg at 08/09/20 0910  . atorvastatin (LIPITOR) tablet 80 mg  80 mg Oral Daily Dixie Dials, MD   80 mg at 08/09/20 0909  . heparin ADULT infusion 100 units/mL (25000 units/294mL)  1,550 Units/hr Intravenous Continuous Dixie Dials, MD 15.5 mL/hr at 08/09/20 0700 1,550 Units/hr at 08/09/20 0700  . hydrALAZINE (APRESOLINE) tablet 25 mg  25 mg Oral Q6H PRN Dixie Dials, MD   25 mg at 08/09/20 0044  . insulin aspart (novoLOG) injection  0-15 Units  0-15 Units Subcutaneous TID WC Dixie Dials, MD   15 Units at 08/09/20 [phone]  . insulin aspart protamine- aspart (NOVOLOG MIX 70/30) injection 20 Units  20 Units Subcutaneous BID WC Dixie Dials, MD   20 Units at 08/09/20 0910  . lisinopril (ZESTRIL) tablet 40 mg  40 mg Oral Daily Dixie Dials, MD   40 mg at 08/09/20 0909  . multivitamin with minerals tablet 1 tablet  1 tablet Oral Daily Dixie Dials, MD   1 tablet at 08/09/20 0909  . nitroGLYCERIN (NITROSTAT) SL tablet 0.4 mg  0.4 mg Sublingual Q5 Min x 3 PRN Dixie Dials, MD      . ondansetron [facility]) injection 4 mg  4 mg Intravenous Q6H PRN Dixie Dials, MD      . propranolol (INDERAL) tablet 40 mg  40 mg Oral Daily Dixie Dials, MD      . remdesivir 200 mg in sodium chloride 0.9% 250 mL IVPB  200 mg Intravenous Once Lyndee Leo, RPH       Followed by  . [START ON 08/10/2020] remdesivir 100 mg in sodium chloride 0.9 % 100 mL IVPB  100 mg Intravenous Daily Lyndee Leo, RPH      . sertraline (ZOLOFT) tablet 100 mg  100 mg Oral Daily Dixie Dials, MD   100 mg at 08/09/20 0909  . traZODone (DESYREL) tablet 50 mg  50 mg Oral QHS Dixie Dials, MD   50 mg at 08/08/20 2150    Medications Prior to Admission  Medication Sig Dispense Refill Last Dose  . aspirin EC 81 MG tablet Take 81 mg by mouth daily. Swallow whole.   08/07/2020 at Unknown time  . b complex vitamins capsule Take 1 capsule by mouth daily.   08/07/2020 at Unknown time  . cetirizine (ZYRTEC) 10 MG tablet Take 1 tablet by mouth daily.   08/07/2020 at Unknown time  . Cholecalciferol (VITAMIN D3) 50 MCG (2000 UT) TABS Take 1 tablet by mouth daily.   08/07/2020 at Unknown time  . furosemide (LASIX) 40 MG tablet Take 40 mg by mouth daily.   08/07/2020 at Unknown time  . glucose blood (RELION GLUCOSE TEST STRIPS) test strip Use as instructed 100 each 2 unknown at unknown  . insulin NPH-regular Human (70-30) 100 UNIT/ML injection  Inject 20 Units into the skin 2 (two) times  daily. (Patient taking differently: Inject 20 Units into the skin 2 (two) times daily. Per sliding scale) 30 mL 1 08/07/2020 at Unknown time  . isosorbide dinitrate (ISORDIL) 10 MG tablet Take 10 mg by mouth 2 (two) times daily.   08/07/2020 at Unknown time  . lisinopril (ZESTRIL) 40 MG tablet Take 40 mg by mouth daily.   08/08/2020 at Unknown time  . Multiple Vitamins-Minerals (MENS MULTIVITAMIN PO) Take 1 tablet by mouth daily.   08/07/2020 at Unknown time  . nitroGLYCERIN (NITROSTAT) 0.4 MG SL tablet Place 0.4 mg under the tongue every 5 (five) minutes as needed for chest pain.   unknown at unknown  . propranolol (INDERAL) 40 MG tablet Take 1 tablet by mouth daily.   08/08/2020 at 1000  . sertraline (ZOLOFT) 100 MG tablet Take 1 tablet by mouth daily.   08/07/2020 at Unknown time  . traZODone (DESYREL) 50 MG tablet Take 1 tablet by mouth at bedtime.   08/07/2020 at Unknown time    Family History  Problem Relation Age of Onset  . Diabetes Mother   . Hypertension Mother   . Hyperlipidemia Mother   . Cancer Mother   . Cancer Father    Review of Systems:     Cardiac Review of Systems: Y or  [    ]= no  Chest Pain [ Y   ]  Resting SOB [   ] Exertional SOB  [ Y ]  Orthopnea [  ]   Pedal Edema [ Y  ]    Palpitations [ N ] Syncope  [ N ]   Presyncope [N   ]  General Review of Systems: [Y] = yes [  ]=no Constitional: recent weight change [ N ]; anorexia Aqua.Slicker  ]; fatigue [ Y ]; nausea [  ]; night sweats [  ]; fever [ N ]; or chills Aqua.Slicker  ]                                                               Dental: Last Dentist visit:   Eye : blurred vision [  ]; diplopia [   ]; vision changes Aqua.Slicker  ];  Amaurosis fugax[  ]; Resp: cough [  ];  wheezing[  ];  hemoptysis[  ]; shortness of breath[  ]; paroxysmal nocturnal dyspnea[  ]; dyspnea on exertion[ Y ]; or orthopnea[  ];  GI:  gallstones[  ], vomiting[  ];  dysphagia[  ]; melena[  ];  hematochezia [  ]; heartburn[  ];   Hx of  Colonoscopy[  ]; GU: kidney stones [  ];  hematuria[  ];   dysuria [  ];  nocturia[  ];  history of     obstruction [  ]; urinary frequency [  ]             Skin: rash, swelling[ N ];, hair loss[  ];  peripheral edema[  ];  or itching[  ]; Musculosketetal: myalgias[  ];  joint swelling[  ];  joint erythema[  ];  joint pain[  ];  back pain[  ];  Heme/Lymph: bruising[  ];  bleeding[  ];  anemia[  ];  Neuro: TIA[ N ];  headaches[  ];  stroke[N  ];  vertigo[  ];  seizures[  ];   paresthesias[  ];  difficulty walking[ N ];  Psych:depression[  ]; anxiety[N ];  Endocrine: diabetes[ Y ];  thyroid dysfunction[N  ];   Physical Exam: BP (!) 179/94 (BP Location: Right Arm)   Pulse (!) 57   Temp 97.9 F (36.6 C) (Oral)   Resp 20   Ht 6\' 1"  (1.854 m)   Wt 109.8 kg   SpO2 99%   BMI 31.94 kg/m   General appearance: alert, cooperative and no distress Head: Normocephalic, without obvious abnormality, atraumatic Neck: no adenopathy, no carotid bruit, no JVD, supple, symmetrical, trachea midline and thyroid not enlarged, symmetric, no tenderness/mass/nodules Resp: clear to auscultation bilaterally Cardio: regular rate and rhythm GI: soft, non-tender; bowel sounds normal; no masses,  no organomegaly Extremities: no rashes, varicose veins, surgical site left upper thigh Neurologic: Grossly normal  Diagnostic Studies & Laboratory data:     Recent Radiology Findings:   DG Chest 2 View  Result Date: 08/08/2020 CLINICAL DATA:  Chest pain EXAM: CHEST - 2 VIEW COMPARISON:  None. FINDINGS: Cardiac shadow is enlarged. The lungs are clear. No bony abnormality is noted. IMPRESSION: Cardiomegaly without acute abnormality. Electronically Signed   By: Inez Catalina M.D.   On: 08/08/2020 21:40     I have independently reviewed the above radiologic studies and discussed with the patient   Recent Lab Findings: Lab Results  Component Value Date   WBC 5.8 08/09/2020   HGB 12.8 (L) 08/09/2020   HCT 38.3 (L) 08/09/2020   PLT 198 08/09/2020   GLUCOSE 360  (H) 08/09/2020   CHOL 259 (H) 08/09/2020   TRIG 304 (H) 08/09/2020   HDL 36 (L) 08/09/2020   LDLCALC 162 (H) 08/09/2020   NA 135 08/09/2020   K 4.4 08/09/2020   CL 102 08/09/2020   CREATININE 1.00 08/09/2020   BUN 13 08/09/2020   CO2 27 08/09/2020   INR 1.0 08/09/2020   HGBA1C 12.0 (H) 08/09/2020    Assessment / Plan:      1. CAD- requesting CABG consult, patient currently chest pain free on Heparin 2. COVID-19, incidental finding, patient is asymptomatic 3. DM- takes insulin at home, uncontrolled A1c is 12.0, would benefit from diabetes education 4. HTN 5. Hyperlipidemia 6. Dispo- patient currently chest pain free, found to be COVID positive on admission.  He is vaccinated and symptomatic.  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  If surgery offered, would need to clarify patients wishes in regards to cell saver and albumin.  Dr. Orvan Seen is aware of patient he will evaluate patient and follow up with further recommendations.  I  spent 55 minutes counseling the patient face to face.   Ellwood Handler, PA-C 08/09/2020 10:01 AM

## 2020-08-09 NOTE — Progress Notes (Signed)
@  [place] Dr. Doylene Canard notified of pt's SBPs returning to the 180s. Verbal order received to give AM Norvasc dose now. Will administer and continue to monitor.

## 2020-08-09 NOTE — Progress Notes (Signed)
@  0312 Dr. Doylene Canard notified of pt's SBPs persisting in the 180s despite PO hydralazine administration. Order received for additional 1 time dose of 50 mg PO Hydralazine. Will administer and continue to monitor.

## 2020-08-09 NOTE — Progress Notes (Signed)
Ref: Emelda Fear, DO   Subjective:  Awake. No chest pain. Discussed COVID-19 treatment and CABG.  Objective:  Vital Signs in the last 24 hours: Temp:  [97.7 F (36.5 C)-98.3 F (36.8 C)] 98.3 F (36.8 C) (03/10 1720) Pulse Rate:  [57-61] 61 (03/10 1720) Cardiac Rhythm: Sinus bradycardia (03/10 0747) Resp:  [14-21] 18 (03/10 1720) BP: (145-196)/(71-109) 185/100 (03/10 1720) SpO2:  [92 %-100 %] 99 % (03/10 1301) Weight:  [109.8 kg] 109.8 kg (03/10 0330)  Physical Exam: BP Readings from Last 1 Encounters:  08/09/20 (!) 185/100     Wt Readings from Last 1 Encounters:  08/09/20 109.8 kg    Weight change:  Body mass index is 31.94 kg/m. HEENT: [place]/AT, Eyes-Brown, PERL, EOMI, Conjunctiva-Pink, Sclera-Non-icteric Neck: No JVD, No bruit, Trachea midline. Lungs:  Clear, Bilateral. Cardiac:  Regular rhythm, normal S1 and S2, no S3. II/VI systolic murmur. Abdomen:  Soft, non-tender. BS present. Extremities:  No edema present. No cyanosis. No clubbing. CNS: AxOx3, Cranial nerves grossly intact, moves all 4 extremities.  Skin: Warm and dry.   Intake/Output from previous day: 03/09 0701 - 03/10 0700 In: 863.4 [P.O.:720; I.V.:143.4] Out: 550 [Urine:550]    Lab Results: BMET    Component Value Date/Time   NA 135 08/09/2020 0128   K 4.4 08/09/2020 0128   CL 102 08/09/2020 0128   CO2 27 08/09/2020 0128   GLUCOSE 360 (H) 08/09/2020 0128   BUN 13 08/09/2020 0128   BUN 14 09/29/2019 0000   CREATININE 1.00 08/09/2020 0128   CREATININE 0.8 09/29/2019 0000   CALCIUM 9.2 08/09/2020 0128   GFRNONAA >60 08/09/2020 0128   CBC    Component Value Date/Time   WBC 5.8 08/09/2020 0128   RBC 4.41 08/09/2020 0128   HGB 12.8 (L) 08/09/2020 0128   HCT 38.3 (L) 08/09/2020 0128   PLT 198 08/09/2020 0128   MCV 86.8 08/09/2020 0128   MCH 29.0 08/09/2020 0128   MCHC 33.4 08/09/2020 0128   RDW 13.7 08/09/2020 0128   HEPATIC Function Panel No results for input(s): PROT in the last 8760  hours.  Invalid input(s):  ALBUMIN,  AST,  ALT,  ALKPHOS,  BILIDIR,  IBILI HEMOGLOBIN A1C No components found for: HGA1C,  MPG CARDIAC ENZYMES No results found for: CKTOTAL, CKMB, CKMBINDEX, TROPONINI BNP No results for input(s): PROBNP in the last 8760 hours. TSH No results for input(s): TSH in the last 8760 hours. CHOLESTEROL Recent Labs    09/29/19 0000 08/09/20 0128  CHOL 206* 259*    Scheduled Meds: . amLODipine  5 mg Oral Daily  . aspirin EC  81 mg Oral Daily  . atorvastatin  80 mg Oral Daily  . insulin aspart  0-15 Units Subcutaneous TID WC  . insulin aspart protamine- aspart  20 Units Subcutaneous BID WC  . lisinopril  40 mg Oral Daily  . multivitamin with minerals  1 tablet Oral Daily  . propranolol  40 mg Oral Daily  . sertraline  100 mg Oral Daily  . traZODone  50 mg Oral QHS   Continuous Infusions: . heparin 1,550 Units/hr (08/09/20 0700)  . [START ON 08/10/2020] remdesivir 100 mg in NS 100 mL     PRN Meds:.acetaminophen, hydrALAZINE, nitroGLYCERIN, ondansetron (ZOFRAN) IV  Assessment/Plan: Acute cornary syndrome Multivessel, native vessel CAD HTN HLD Type 2 DM Obesity COVID-19 infection  Continue remdesivir. Awaiting CABG when stable.   LOS: 1 day   Time spent including chart review, lab review, examination, discussion with patient/Nurse/Doctor :  26 min   Dixie Dials  MD  08/09/2020, 6:43 PM

## 2020-08-10 DIAGNOSIS — I255 Ischemic cardiomyopathy: Secondary | ICD-10-CM | POA: Diagnosis present

## 2020-08-10 DIAGNOSIS — I249 Acute ischemic heart disease, unspecified: Principal | ICD-10-CM

## 2020-08-10 DIAGNOSIS — U071 COVID-19: Secondary | ICD-10-CM | POA: Clinically undetermined

## 2020-08-10 LAB — GLUCOSE, CAPILLARY
Glucose-Capillary: 164 mg/dL — ABNORMAL HIGH (ref 70–99)
Glucose-Capillary: 208 mg/dL — ABNORMAL HIGH (ref 70–99)
Glucose-Capillary: 184 mg/dL — ABNORMAL HIGH (ref 70–99)
Glucose-Capillary: 72 mg/dL (ref 70–99)

## 2020-08-10 LAB — MAGNESIUM: Magnesium: 1.9 mg/dL (ref 1.7–2.4)

## 2020-08-10 LAB — CBC
HCT: 39.7 % (ref 39.0–52.0)
Hemoglobin: 13.3 g/dL (ref 13.0–17.0)
MCH: 29 pg (ref 26.0–34.0)
MCHC: 33.5 g/dL (ref 30.0–36.0)
MCV: 86.7 fL (ref 80.0–100.0)
Platelets: 213 10*3/uL (ref 150–400)
RBC: 4.58 MIL/uL (ref 4.22–5.81)
RDW: 13.8 % (ref 11.5–15.5)
WBC: 6.5 10*3/uL (ref 4.0–10.5)
nRBC: 0 % (ref 0.0–0.2)

## 2020-08-10 LAB — CBC WITH DIFFERENTIAL/PLATELET
Abs Immature Granulocytes: 0.02 10*3/uL (ref 0.00–0.07)
Basophils Absolute: 0 10*3/uL (ref 0.0–0.1)
Basophils Relative: 1 %
Eosinophils Absolute: 0.2 10*3/uL (ref 0.0–0.5)
Eosinophils Relative: 2 %
HCT: 42.1 % (ref 39.0–52.0)
Hemoglobin: 14 g/dL (ref 13.0–17.0)
Immature Granulocytes: 0 %
Lymphocytes Relative: 30 %
Lymphs Abs: 1.9 10*3/uL (ref 0.7–4.0)
MCH: 28.9 pg (ref 26.0–34.0)
MCHC: 33.3 g/dL (ref 30.0–36.0)
MCV: 86.8 fL (ref 80.0–100.0)
Monocytes Absolute: 0.7 10*3/uL (ref 0.1–1.0)
Monocytes Relative: 11 %
Neutro Abs: 3.6 10*3/uL (ref 1.7–7.7)
Neutrophils Relative %: 56 %
Platelets: 216 10*3/uL (ref 150–400)
RBC: 4.85 MIL/uL (ref 4.22–5.81)
RDW: 13.7 % (ref 11.5–15.5)
WBC: 6.3 10*3/uL (ref 4.0–10.5)
nRBC: 0 % (ref 0.0–0.2)

## 2020-08-10 LAB — COMPREHENSIVE METABOLIC PANEL
ALT: 13 U/L (ref 0–44)
AST: 18 U/L (ref 15–41)
Albumin: 3.2 g/dL — ABNORMAL LOW (ref 3.5–5.0)
Alkaline Phosphatase: 64 U/L (ref 38–126)
Anion gap: 7 (ref 5–15)
BUN: 11 mg/dL (ref 8–23)
CO2: 28 mmol/L (ref 22–32)
Calcium: 9.4 mg/dL (ref 8.9–10.3)
Chloride: 103 mmol/L (ref 98–111)
Creatinine, Ser: 1.02 mg/dL (ref 0.61–1.24)
GFR, Estimated: >60 mL/min (ref >60)
Glucose, Bld: 138 mg/dL — ABNORMAL HIGH (ref 70–99)
Potassium: 4 mmol/L (ref 3.5–5.1)
Sodium: 138 mmol/L (ref 135–145)
Total Bilirubin: 0.7 mg/dL (ref 0.3–1.2)
Total Protein: 6.5 g/dL (ref 6.5–8.1)

## 2020-08-10 LAB — C-REACTIVE PROTEIN: CRP: 0.9 mg/dL (ref <1.0)

## 2020-08-10 LAB — HEPARIN LEVEL (UNFRACTIONATED): Heparin Unfractionated: 0.52 IU/mL (ref 0.30–0.70)

## 2020-08-10 LAB — LACTATE DEHYDROGENASE: LDH: 167 U/L (ref 98–192)

## 2020-08-10 LAB — FIBRINOGEN: Fibrinogen: 583 mg/dL — ABNORMAL HIGH (ref 210–475)

## 2020-08-10 LAB — D-DIMER, QUANTITATIVE: D-Dimer, Quant: 0.74 ug/mL-FEU — ABNORMAL HIGH (ref 0.00–0.50)

## 2020-08-10 LAB — FERRITIN: Ferritin: 111 ng/mL (ref 24–336)

## 2020-08-10 LAB — PROCALCITONIN: Procalcitonin: <0.1 ng/mL

## 2020-08-10 MED ORDER — SODIUM CHLORIDE 0.9 % IV SOLN
100.0000 mg | Freq: Every day | INTRAVENOUS | Status: AC
  Administered 2020-08-10 – 2020-08-11 (×2): 100 mg via INTRAVENOUS
  Filled 2020-08-10 (×3): qty 20

## 2020-08-10 NOTE — Progress Notes (Signed)
Likely to be incidental COVID. Ok to reduce remdesivir to 3d per Dr. Doylene Canard.  Onnie Boer, PharmD, BCIDP, AAHIVP, CPP Infectious Disease Pharmacist 08/10/2020 9:39 AM

## 2020-08-10 NOTE — Progress Notes (Signed)
Inpatient Diabetes Program Recommendations  AACE/ADA: New Consensus Statement on Inpatient Glycemic Control   Target Ranges:  Prepandial:   less than 140 mg/dL      Peak postprandial:   less than 180 mg/dL (1-2 hours)      Critically ill patients:  140 - 180 mg/dL   Results for Brian Carpenter, Brian Carpenter (MRN [id number]) as of 08/10/2020 11:34  Ref. Range 08/09/2020 06:20 08/09/2020 12:54 08/09/2020 17:19 08/09/2020 21:16 08/10/2020 06:22 08/10/2020 9:08  Glucose-Capillary Latest Ref Range: 70 - 99 mg/dL 362 (H)  Novolog 15 units  70/30 20 units  78 180 (H)  Novolog 2 units  70/30 20 units 122 (H) 164 (H)  Novolog 3 units      70/30 20 units  Results for Brian Carpenter, Brian Carpenter (MRN [id number]) as of 08/10/2020 11:34  Ref. Range 01/04/2020 10:17 08/09/2020 01:28  Hemoglobin A1C Latest Ref Range: 4.8 - 5.6 % 8.4 (A) 12.0 (H)   Review of Glycemic Control  Diabetes history: DM2 Outpatient Diabetes medications: 70/30 10-20 units BID with breakfast and supper (dose based on glucose) Current orders for Inpatient glycemic control: 70/30 20 units BID, Novolog 0-15 units TID with meals  Inpatient Diabetes Program Recommendations:    HbgA1C:  A1C 12% on 08/09/20 indicating an average glucose of 297 mg/dl over the past 2-3 months. Would recommend discharging on same 70/30 insulin regimen currently ordered as long as glucose remains fairly well controlled on this dose.  NOTE: Inpatient Diabetes Coordinator spoke with patient over the phone on 08/09/20 regarding DM control. Anticipate patient needs to be taking set dose of 70/30 BID (not range) and follow up with PCP regarding DM control.    Thanks, Barnie Alderman, RN, MSN, CDE Diabetes Coordinator Inpatient Diabetes Program [phone] (Team Pager from 8am to 5pm)

## 2020-08-10 NOTE — Progress Notes (Signed)
Ref: Emelda Fear, DO   Subjective:  Awake. Shortness of breath with little activity. 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 VS stable at rest.  Objective:  Vital Signs in the last 24 hours: Temp:  [97.7 F (36.5 C)-98.4 F (36.9 C)] 97.7 F (36.5 C) (03/11 1221) Pulse Rate:  [60-73] 60 (03/11 1221) Cardiac Rhythm: Sinus bradycardia;Heart block (03/11 0750) Resp:  [16-20] 20 (03/11 1221) BP: (132-196)/(68-100) 132/78 (03/11 1221) SpO2:  [94 %-99 %] 97 % (03/11 1221) Weight:  [109.6 kg] 109.6 kg (03/11 0402)  Physical Exam: BP Readings from Last 1 Encounters:  08/10/20 132/78     Wt Readings from Last 1 Encounters:  08/10/20 109.6 kg    Weight change: -0.3 kg Body mass index is 31.88 kg/m. HEENT: [place]/AT, Eyes-Brown, PERL, EOMI, Conjunctiva-Pink, Sclera-Non-icteric Neck: No JVD, No bruit, Trachea midline. Lungs:  Clear, Bilateral. Cardiac:  Regular rhythm, normal S1 and S2, no S3. II/VI systolic murmur. Abdomen:  Soft, non-tender. BS present. Extremities:  No edema present. No cyanosis. No clubbing. CNS: AxOx3, Cranial nerves grossly intact, moves all 4 extremities.  Skin: Warm and dry.   Intake/Output from previous day: 03/10 0701 - 03/11 0700 In: 557 [P.O.:240; I.V.:317] Out: 725 [Urine:725]    Lab Results: BMET    Component Value Date/Time   NA 138 08/10/2020 0124   NA 135 08/09/2020 0128   K 4.0 08/10/2020 0124   K 4.4 08/09/2020 0128   CL 103 08/10/2020 0124   CL 102 08/09/2020 0128   CO2 28 08/10/2020 0124   CO2 27 08/09/2020 0128   GLUCOSE 138 (H) 08/10/2020 0124   GLUCOSE 360 (H) 08/09/2020 0128   BUN 11 08/10/2020 0124   BUN 13 08/09/2020 0128   BUN 14 09/29/2019 0000   CREATININE 1.02 08/10/2020 0124   CREATININE 1.00 08/09/2020 0128   CREATININE 0.8 09/29/2019 0000   CALCIUM 9.4 08/10/2020 0124   CALCIUM 9.2 08/09/2020 0128   GFRNONAA >60 08/10/2020 0124   GFRNONAA >60 08/09/2020 0128   CBC    Component Value  Date/Time   WBC 6.3 08/10/2020 0938   RBC 4.85 08/10/2020 0938   HGB 14.0 08/10/2020 0938   HCT 42.1 08/10/2020 0938   PLT 216 08/10/2020 0938   MCV 86.8 08/10/2020 0938   MCH 28.9 08/10/2020 0938   MCHC 33.3 08/10/2020 0938   RDW 13.7 08/10/2020 0938   LYMPHSABS 1.9 08/10/2020 0938   MONOABS 0.7 08/10/2020 0938   EOSABS 0.2 08/10/2020 0938   BASOSABS 0.0 08/10/2020 0938   HEPATIC Function Panel Recent Labs    08/10/20 0124  PROT 6.5   HEMOGLOBIN A1C No components found for: HGA1C,  MPG CARDIAC ENZYMES No results found for: CKTOTAL, CKMB, CKMBINDEX, TROPONINI BNP No results for input(s): PROBNP in the last 8760 hours. TSH No results for input(s): TSH in the last 8760 hours. CHOLESTEROL Recent Labs    09/29/19 0000 08/09/20 0128  CHOL 206* 259*    Scheduled Meds: . amLODipine  5 mg Oral Daily  . aspirin EC  81 mg Oral Daily  . atorvastatin  80 mg Oral Daily  . insulin aspart  0-15 Units Subcutaneous TID WC  . insulin aspart protamine- aspart  20 Units Subcutaneous BID WC  . lisinopril  40 mg Oral Daily  . multivitamin with minerals  1 tablet Oral Daily  . propranolol  40 mg Oral Daily  . sertraline  100 mg Oral Daily  . traZODone  50 mg Oral  QHS   Continuous Infusions: . heparin 1,550 Units/hr (08/09/20 0700)  . remdesivir 100 mg in NS 100 mL 100 mg (08/10/20 1227)   PRN Meds:.acetaminophen, hydrALAZINE, nitroGLYCERIN, ondansetron (ZOFRAN) IV  Assessment/Plan: Acute coronary syndrome Multivessel, native vessel CAD HTN HLD Type 2 DM with hyperglycemia, improving control Obesity COVID-19 infection  Continue medical treatment. Awaiting CABG. Dr. Terrence Dupont covering for weekend and Monday.   LOS: 2 days   Time spent including chart review, lab review, examination, discussion with patient :  min   Dixie Dials  MD  08/10/2020, 1:26 PM

## 2020-08-10 NOTE — Consult Note (Signed)
Medical Consultation   Brian Carpenter  BJY:[id number]  DOB: February 28, 1951  DOA: 08/08/2020  PCP: Emelda Fear, DO   Outpatient Specialists: [facility]ris [facility]etch - endocrinology; [place] - bariatrics; [facility] - GI   Requesting physician: Doylene Canard - cardiology  Reason for consultation: Need to keep an eye on him due to [place].  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  Consulted CVTS for possible CABG.  He is COVID + but not checked while at [facility].  Incidental, started on Remdesivir.   History of Present Illness: Brian Carpenter is an 70 y.o. male with h/o HTN; HLD; obesity s/p gastric bypass; and DM presenting as a transfer from [facility] due to multivessel CAD.  He was admitted by Dr. Doylene Canard on 3/9.  He was subsequently found to have COVID on 3/9.  He reports 1-2 weeks of mild intermittent cough.  He developed acute onset of substernal and left-sided chest pain with significant SOB.  He currently feels generally well and without symptoms.    Review of Systems:  ROS As per HPI otherwise 10 point review of systems negative.    Past Medical History: Past Medical History:  Diagnosis Date  . Cancer ([place])   . Diabetes mellitus, type II ([place])   . Hyperlipidemia   . Hypertension     Past Surgical History: Past Surgical History:  Procedure Laterality Date  . CARPAL TUNNEL RELEASE    . ELBOW ARTHROSCOPY    . fibrous sarcoma    . GASTRIC BYPASS    . KNEE ARTHROSCOPY       Allergies:  No Known Allergies   Social History:  reports that he has never smoked. He does not have any smokeless tobacco history on file. He reports current alcohol use. He reports that he does not use drugs.   Family History: Family History  Problem Relation Age of Onset  . Diabetes Mother   . Hypertension Mother   . Hyperlipidemia Mother   . Cancer Mother   . Cancer Father       Physical Exam: Vitals:   08/10/20 0359 08/10/20 0402 08/10/20 0630 08/10/20 0858  BP: (!) 196/98  (!) 190/68 (!) 181/96   Pulse:    73  Resp: 16  16 16   Temp:    97.8 F (36.6 C)  TempSrc:    Oral  SpO2: 95%  99% 99%  Weight:  109.6 kg    Height:        Constitutional: Alert and awake, oriented x3, not in any acute distress. Eyes: EOMI, irises appear normal, anicteric sclera,  ENMT: external ears and nose appear normal, hard of hearing, Lips appear normal, oropharynx mucosa, tongue appear normal  Neck: neck appears normal, no masses, normal ROM, no thyromegaly, no JVD  CVS: S1-S2 clear, no murmur rubs or gallops, no LE edema, normal pedal pulses  Respiratory:  clear to auscultation bilaterally, no wheezing, rales or rhonchi. Respiratory effort normal. No accessory muscle use.  Abdomen: soft nontender, nondistended Musculoskeletal: : no cyanosis, clubbing or edema noted bilaterally Neuro: Cranial nerves II-XII intact Psych: judgement and insight appear normal, stable mood and affect, mental status Skin: no rashes or lesions or ulcers, no induration or nodules    Data reviewed:  I have personally reviewed the recent labs and imaging studies  Pertinent Labs:   Glucose 138 Albumin 3.2 Normal CBC, normal differential Lipids on 3/10: 359/36/162/304 A1c 12.0 COVID + on 3/9 D-dimer 0.74 Fibrinogen  583   Inpatient Medications:   Scheduled Meds: . amLODipine  5 mg Oral Daily  . aspirin EC  81 mg Oral Daily  . atorvastatin  80 mg Oral Daily  . insulin aspart  0-15 Units Subcutaneous TID WC  . insulin aspart protamine- aspart  20 Units Subcutaneous BID WC  . lisinopril  40 mg Oral Daily  . multivitamin with minerals  1 tablet Oral Daily  . propranolol  40 mg Oral Daily  . sertraline  100 mg Oral Daily  . traZODone  50 mg Oral QHS   Continuous Infusions: . heparin 1,550 Units/hr (08/09/20 0700)  . remdesivir 100 mg in NS 100 mL       Radiological Exams on Admission: DG Chest 2 View  Result Date: 08/08/2020 CLINICAL DATA:  Chest pain EXAM: CHEST - 2 VIEW COMPARISON:  None. FINDINGS: Cardiac  shadow is enlarged. The lungs are clear. No bony abnormality is noted. IMPRESSION: Cardiomegaly without acute abnormality. Electronically Signed   By: Inez Catalina M.D.   On: 08/08/2020 21:40   CT CHEST WO CONTRAST  Result Date: 08/09/2020 CLINICAL DATA:  Thoracic aortic disease, preoperative planning CABG EXAM: CT CHEST WITHOUT CONTRAST TECHNIQUE: Multidetector CT imaging of the chest was performed following the standard protocol without IV contrast. COMPARISON:  None. FINDINGS: Cardiovascular: Scattered aortic atherosclerosis. Incidental note of bovine type 2 vessel branching pattern of the aortic arch. Normal heart size. Three-vessel coronary artery calcifications and/or stents. No pericardial effusion. The left brachiocephalic vein lies 1.8 cm posterior to the manubrium. The tubular ascending aorta lies 2.1 cm posterior to the sternal body. The right ventricle lies 0.8 cm posterior to the sternal body. Mediastinum/Nodes: No enlarged mediastinal, hilar, or axillary lymph nodes. Thyroid gland, trachea, and esophagus demonstrate no significant findings. Lungs/Pleura: Minimal scarring of the lung bases. No pleural effusion or pneumothorax. Upper Abdomen: No acute abnormality. 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 Nonobstructive superior pole calculus of the right kidney. Musculoskeletal: No chest wall mass or suspicious bone lesions identified. IMPRESSION: 1. The left brachiocephalic vein lies 1.8 cm posterior to the manubrium. The tubular ascending aorta lies 2.1 cm posterior to the sternal body. The right ventricle lies 0.8 cm posterior to the sternal body. 2. Scattered aortic atherosclerosis. 3. Coronary artery disease. 4. Nonobstructive right nephrolithiasis. Aortic Atherosclerosis (ICD10-I70.0). Electronically Signed   By: Eddie Candle M.D.   On: 08/09/2020 13:36   ECHOCARDIOGRAM COMPLETE  Result Date: 08/09/2020    ECHOCARDIOGRAM REPORT    Patient Name:   Brian Carpenter Date of Exam: 08/09/2020 Medical Rec #:  [id number]        Height:       73.0 in Accession #:    [id number]       Weight:       242.1 lb Date of Birth:  1950-12-17        BSA:          2.333 m Patient Age:    4 years         BP:           159/94 mmHg Patient Gender: M                HR:           63 bpm. Exam Location:  Inpatient Procedure: 2D Echo, Cardiac Doppler and Color Doppler Indications:     CAD  History:         Patient  has no prior history of Echocardiogram examinations.                  CAD; Risk Factors:Diabetes. Covid 19 positive.  Sonographer:     Merrie Roof RDCS Referring Phys:  [place] Diagnosing Phys: Dixie Dials MD IMPRESSIONS  1. Left ventricular ejection fraction, by estimation, is 35 to 40%. The left ventricle has moderately decreased function. The left ventricle demonstrates global hypokinesis. The left ventricular internal cavity size was mildly dilated. There is mild concentric left ventricular hypertrophy. Left ventricular diastolic parameters are consistent with Grade I diastolic dysfunction (impaired relaxation).  2. Right ventricular systolic function is normal. The right ventricular size is normal.  3. Left atrial size was severely dilated.  4. Right atrial size was moderately dilated.  5. The mitral valve is degenerative. Mild mitral valve regurgitation. No evidence of mitral stenosis.  6. The aortic valve is tricuspid. Aortic valve regurgitation is not visualized. Mild aortic valve sclerosis is present, with no evidence of aortic valve stenosis.  7. The inferior vena cava is normal in size with greater than 50% respiratory variability, suggesting right atrial pressure of 3 mmHg. FINDINGS  Left Ventricle: Left ventricular ejection fraction, by estimation, is 35 to 40%. The left ventricle has moderately decreased function. The left ventricle demonstrates global hypokinesis. The left ventricular internal cavity size was mildly dilated. There is  mild concentric left ventricular hypertrophy. Left ventricular diastolic parameters are consistent with Grade I diastolic dysfunction (impaired relaxation). Right Ventricle: The right ventricular size is normal. No increase in right ventricular wall thickness. Right ventricular systolic function is normal. Left Atrium: Left atrial size was severely dilated. Right Atrium: Right atrial size was moderately dilated. Pericardium: There is no evidence of pericardial effusion. Mitral Valve: The mitral valve is degenerative in appearance. There is mild thickening of the mitral valve leaflet(s). There is mild calcification of the mitral valve leaflet(s). Mild mitral annular calcification. Mild mitral valve regurgitation. No evidence of mitral valve stenosis. Tricuspid Valve: The tricuspid valve is normal in structure. Tricuspid valve regurgitation is mild. Aortic Valve: The aortic valve is tricuspid. Aortic valve regurgitation is not visualized. Mild aortic valve sclerosis is present, with no evidence of aortic valve stenosis. Pulmonic Valve: The pulmonic valve was not well visualized. Pulmonic valve regurgitation is not visualized. Aorta: The aortic root is normal in size and structure. There is minimal (Grade I) atheroma plaque involving the ascending aorta and aortic root. Venous: The inferior vena cava is normal in size with greater than 50% respiratory variability, suggesting right atrial pressure of 3 mmHg. IAS/Shunts: The interatrial septum was not well visualized.   Diastology LV e' medial:    4.91 cm/s LV E/e' medial:  20.5 LV e' lateral:   6.96 cm/s LV E/e' lateral: 14.4  RIGHT VENTRICLE RV Basal diam:  4.70 cm RV Mid diam:    3.30 cm LEFT ATRIUM              Index       RIGHT ATRIUM           Index LA Vol (A2C):   178.0 ml 76.28 ml/m RA Area:     25.10 cm LA Vol (A4C):   117.0 ml 50.14 ml/m RA Volume:   94.30 ml  40.41 ml/m LA Biplane Vol: 145.0 ml 62.14 ml/m  MITRAL VALVE MV Area (PHT): 6.77 cm MV Decel  Time: 112 msec MV E velocity: 100.50 cm/s MV A velocity: 75.00 cm/s MV E/A ratio:  1.34 Dixie Dials MD Electronically signed by Dixie Dials MD Signature Date/Time: 08/09/2020/7:09:45 PM    Final     Impression/Recommendations Principal Problem:   Acute coronary syndrome [facility]) Active Problems:   Type 2 diabetes mellitus with hyperglycemia, without long-term current use of insulin (HCC)   Essential hypertension, benign   Mixed hyperlipidemia   Ischemic cardiomyopathy   COVID-19 virus infection   ACS -Patient with substernal chest pain that came on acutely -CXR unremarkable.   -Cath revealed multivessel disease -It is possible that his symptoms were accelerated by COVID-19 infection, but not necessarily so -Dr. Doylene Canard from cardiology admitted the patient and is managing his CAD with CVTS consultation -On Heparin drip  Incidental COVID -Patient with 2 immunizations - recently received his 2nd dose -No apparent current symptoms, although he did have mild intermittent cough x the last 2 weeks -No O2 requirement -Negative CXR, chest CT -COVID POSITIVE -The patient has comorbidities which may increase the risk for ARDS/MODS including:  HTN, DM, CAD, CHF, obesity -Pertinent labs concerning for COVID include normal WBC; elevated D-dimer; elevated fibrinogen; other COVID labs are still pending -Will order Remdesivir (pharmacy consult) given +COVID test for incidental COVID infection  Ischemic cardiomyopathy -Echo with EF 35-40% and grade 1 diastolic dysfunction -Appears to be compensated at this time -Likely needs repeat Echo following CABG -Consider Entresto  HTN -Continue beta-blocker -Resume ACE vs. Start Entresto when appropriate by cardiology  HLD -Started on Lipitor 80 mg daily -Lipids were checked and indicate very poor control with LDL 168  DM -A1c is 12, indicating very poor control -He takes 70/30 at home and this has been continued with SSI -Would suggest  considering a transition to Lantus with qAC and SSI coverage -However, his glucose is well controlled at this time so will not transition currently -DM coordinator has been consulted  Obesity -Body mass index is 31.88 kg/m..  -s/p gastric bypass -Weight loss should be encouraged -Outpatient PCP/bariatric medicine f/u encouraged   Thank you for this consultation.  Our [facility] hospitalist team will follow the patient with you for one additional day; if no new issues we may sign off at that time.   Time Spent: 50 minutes  Karmen Bongo M.D. [facility] 08/10/2020, 10:58 AM

## 2020-08-10 NOTE — Progress Notes (Signed)
ANTICOAGULATION CONSULT NOTE - Initial Consult  Pharmacy Consult for heparin Indication: chest pain/ACS  No Known Allergies  Patient Measurements: Height: 6\' 1"  (185.4 cm) Weight: 109.6 kg (241 lb 10 oz) IBW/kg (Calculated) : 79.9 Heparin Dosing Weight: 110kg  Vital Signs: Temp: 97.8 F (36.6 C) (03/11 0858) Temp Source: Oral (03/11 0858) BP: 181/96 (03/11 0858) Pulse Rate: 73 (03/11 0858)  Labs: Recent Labs    08/08/20 2009 08/09/20 0128 08/10/20 0124  HGB  --  12.8* 13.3  HCT  --  38.3* 39.7  PLT  --  198 213  LABPROT  --  12.9  --   INR  --  1.0  --   HEPARINUNFRC 0.10* 0.28* 0.52  CREATININE  --  1.00 1.02    Estimated Creatinine Clearance: 88.8 mL/min (by C-G formula based on SCr of 1.02 mg/dL).   Medical History: Past Medical History:  Diagnosis Date  . Cancer ([place])   . Diabetes mellitus, type II ([place])   . Hyperlipidemia   . Hypertension     Medications:  Medications Prior to Admission  Medication Sig Dispense Refill Last Dose  . aspirin EC 81 MG tablet Take 81 mg by mouth daily. Swallow whole.   08/07/2020 at Unknown time  . b complex vitamins capsule Take 1 capsule by mouth daily.   08/07/2020 at Unknown time  . cetirizine (ZYRTEC) 10 MG tablet Take 1 tablet by mouth daily.   08/07/2020 at Unknown time  . Cholecalciferol (VITAMIN D3) 50 MCG (2000 UT) TABS Take 1 tablet by mouth daily.   08/07/2020 at Unknown time  . furosemide (LASIX) 40 MG tablet Take 40 mg by mouth daily.   08/07/2020 at Unknown time  . glucose blood (RELION GLUCOSE TEST STRIPS) test strip Use as instructed 100 each 2 unknown at unknown  . insulin NPH-regular Human (70-30) 100 UNIT/ML injection Inject 20 Units into the skin 2 (two) times daily. (Patient taking differently: Inject 20 Units into the skin 2 (two) times daily. Per sliding scale) 30 mL 1 08/07/2020 at Unknown time  . isosorbide dinitrate (ISORDIL) 10 MG tablet Take 10 mg by mouth 2 (two) times daily.   08/07/2020 at Unknown time  .  lisinopril (ZESTRIL) 40 MG tablet Take 40 mg by mouth daily.   08/08/2020 at Unknown time  . Multiple Vitamins-Minerals (MENS MULTIVITAMIN PO) Take 1 tablet by mouth daily.   08/07/2020 at Unknown time  . nitroGLYCERIN (NITROSTAT) 0.4 MG SL tablet Place 0.4 mg under the tongue every 5 (five) minutes as needed for chest pain.   unknown at unknown  . propranolol (INDERAL) 40 MG tablet Take 1 tablet by mouth daily.   08/08/2020 at 1000  . sertraline (ZOLOFT) 100 MG tablet Take 1 tablet by mouth daily.   08/07/2020 at Unknown time  . traZODone (DESYREL) 50 MG tablet Take 1 tablet by mouth at bedtime.   08/07/2020 at Unknown time   Scheduled:  . amLODipine  5 mg Oral Daily  . aspirin EC  81 mg Oral Daily  . atorvastatin  80 mg Oral Daily  . insulin aspart  0-15 Units Subcutaneous TID WC  . insulin aspart protamine- aspart  20 Units Subcutaneous BID WC  . lisinopril  40 mg Oral Daily  . multivitamin with minerals  1 tablet Oral Daily  . propranolol  40 mg Oral Daily  . sertraline  100 mg Oral Daily  . traZODone  50 mg Oral QHS   Infusions:  . heparin 1,550 Units/hr (  08/09/20 0700)  . remdesivir 100 mg in NS 100 mL      Assessment: Pt was transferred from [place] for CP. He is s/p cath>>3VD. He was started on IV heparin there post cath around 12PM. He was transferred for CABG. Heparin has been running at 1000 units/hr.  He is not on anticoagulation prior to admission.   Heparin level is therapeutic this AM. Incidental COVID>>remdesivir. Hgb stable. Awaiting for CABG eval.   Goal of Therapy:  Heparin level 0.3-0.7 units/ml Monitor platelets by anticoagulation protocol: Yes   Plan:   Continue heparin 1550 units/hr Daily heparin level and CBC  Onnie Boer, PharmD, BCIDP, AAHIVP, CPP Infectious Disease Pharmacist

## 2020-08-11 DIAGNOSIS — U071 COVID-19: Secondary | ICD-10-CM

## 2020-08-11 DIAGNOSIS — I10 Essential (primary) hypertension: Secondary | ICD-10-CM

## 2020-08-11 LAB — HEPARIN LEVEL (UNFRACTIONATED): Heparin Unfractionated: 0.54 IU/mL (ref 0.30–0.70)

## 2020-08-11 LAB — GLUCOSE, CAPILLARY
Glucose-Capillary: 163 mg/dL — ABNORMAL HIGH (ref 70–99)
Glucose-Capillary: 109 mg/dL — ABNORMAL HIGH (ref 70–99)
Glucose-Capillary: 164 mg/dL — ABNORMAL HIGH (ref 70–99)
Glucose-Capillary: 88 mg/dL (ref 70–99)

## 2020-08-11 LAB — CBC
HCT: 39.9 % (ref 39.0–52.0)
Hemoglobin: 13.4 g/dL (ref 13.0–17.0)
MCH: 29.3 pg (ref 26.0–34.0)
MCHC: 33.6 g/dL (ref 30.0–36.0)
MCV: 87.1 fL (ref 80.0–100.0)
Platelets: 197 10*3/uL (ref 150–400)
RBC: 4.58 MIL/uL (ref 4.22–5.81)
RDW: 14 % (ref 11.5–15.5)
WBC: 7.5 10*3/uL (ref 4.0–10.5)
nRBC: 0 % (ref 0.0–0.2)

## 2020-08-11 LAB — COMPREHENSIVE METABOLIC PANEL
ALT: 14 U/L (ref 0–44)
AST: 20 U/L (ref 15–41)
Albumin: 3.1 g/dL — ABNORMAL LOW (ref 3.5–5.0)
Alkaline Phosphatase: 64 U/L (ref 38–126)
Anion gap: 8 (ref 5–15)
BUN: 12 mg/dL (ref 8–23)
CO2: 23 mmol/L (ref 22–32)
Calcium: 9.3 mg/dL (ref 8.9–10.3)
Chloride: 106 mmol/L (ref 98–111)
Creatinine, Ser: 0.84 mg/dL (ref 0.61–1.24)
GFR, Estimated: >60 mL/min (ref >60)
Glucose, Bld: 205 mg/dL — ABNORMAL HIGH (ref 70–99)
Potassium: 3.9 mmol/L (ref 3.5–5.1)
Sodium: 137 mmol/L (ref 135–145)
Total Bilirubin: 0.8 mg/dL (ref 0.3–1.2)
Total Protein: 6.4 g/dL — ABNORMAL LOW (ref 6.5–8.1)

## 2020-08-11 MED ORDER — EMPAGLIFLOZIN 10 MG PO TABS
10.0000 mg | ORAL_TABLET | Freq: Every day | ORAL | Status: DC
  Administered 2020-08-11 – 2020-08-15 (×5): 10 mg via ORAL
  Filled 2020-08-11 (×5): qty 1

## 2020-08-11 MED ORDER — PANTOPRAZOLE SODIUM 40 MG PO TBEC
40.0000 mg | DELAYED_RELEASE_TABLET | Freq: Every day | ORAL | Status: DC
  Administered 2020-08-11 – 2020-08-15 (×5): 40 mg via ORAL
  Filled 2020-08-11 (×5): qty 1

## 2020-08-11 MED ORDER — ISOSORBIDE MONONITRATE ER 30 MG PO TB24
30.0000 mg | ORAL_TABLET | Freq: Every day | ORAL | Status: DC
  Administered 2020-08-11: 30 mg via ORAL
  Filled 2020-08-11: qty 1

## 2020-08-11 NOTE — Progress Notes (Signed)
PROGRESS NOTE                                                                             PROGRESS NOTE                                                                                                                                                                                                             Patient Demographics:    Joseguadalupe Stan, is a 70 y.o. male, DOB - [birth year], OMV:[id number]  Outpatient Primary MD for the patient is Emelda Fear, DO    LOS - 3  Admit date - 08/08/2020    No chief complaint on file.      Brief Narrative    Dquan Cortopassi is an 70 y.o. male with h/o HTN; HLD; obesity s/p gastric bypass; and DM presenting as a transfer from [facility] due to multivessel CAD.  He was admitted by Dr. Doylene Canard on 3/9.  He was subsequently found to have COVID on 3/9.  He reports 1-2 weeks of mild intermittent cough.  He developed acute onset of substernal and left-sided chest pain with significant SOB.  He currently feels generally well and without symptoms.    Subjective:    Walker Sitar today denies any chest pain, reports mild cough, denies any dyspnea at rest, reports some mild dyspnea with activity. .    Assessment  & Plan :    Principal Problem:   Acute coronary syndrome (HCC) Active Problems:   Type 2 diabetes mellitus with hyperglycemia, without long-term current use of insulin (HCC)   Essential hypertension, benign   Mixed hyperlipidemia   Ischemic cardiomyopathy   COVID-19 virus infection    ACS -Patient with substernal chest pain that came on acutely -CXR unremarkable.  -Cath revealed multivessel disease -Dr. Doylene Canard from cardiology admitted the patient and is managing his CAD with CVTS consultation -On Heparin drip  Incidental COVID -Patient with 2 immunizations - recently received his 2nd dose -No apparent current symptoms, although he did have mild intermittent cough x the last  2 weeks -No O2  requirement -Negative CXR, chest CT -COVID POSITIVE -The patient has comorbidities which may increase the risk for ARDS/MODS including:  HTN, DM, CAD, CHF, obesity -Pertinent labs concerning for COVID include normal WBC; elevated D-dimer; elevated fibrinogen; other COVID labs are still pending -Continue with remdesivir x3 days  Ischemic cardiomyopathy -Echo with EF 35-40% and grade 1 diastolic dysfunction -Appears to be compensated at this time -Likely needs repeat Echo following CABG -Consider Entresto  HTN -Continue beta-blocker -Resume ACE vs. Start Entresto when appropriate by cardiology -Blood pressure remains elevated, started on Imdur by cardiology  HLD -Started on Lipitor 80 mg daily -Lipids were checked and indicate very poor control with LDL 168  DM -A1c is 12, indicating very poor control -He takes 70/30 at home and this has been continued with SSI -Continue with current dose insulin 70/30, continue with sliding scale, he was started on -started on Jardiance.  Obesity -Body mass index is 31.88 kg/m..  -s/p gastric bypass -Weight loss should be encouraged -Outpatient PCP/bariatric medicine f/u encouraged     SpO2: 92 %  Recent Labs  Lab 08/08/20 1927 08/09/20 0128 08/10/20 0124 08/10/20 0938 08/11/20 0141  WBC  --  5.8 6.5 6.3 7.5  PLT  --  198 213 216 197  CRP  --   --   --  0.9  --   DDIMER  --   --   --  0.74*  --   PROCALCITON  --   --   --  <0.10  --   AST  --   --  18  --  20  ALT  --   --  13  --  14  ALKPHOS  --   --  64  --  64  BILITOT  --   --  0.7  --  0.8  ALBUMIN  --   --  3.2*  --  3.1*  INR  --  1.0  --   --   --   SARSCOV2NAA POSITIVE*  --   --   --   --        ABG  No results found for: PHART, PCO2ART, PO2ART, HCO3, TCO2, ACIDBASEDEF, O2SAT       Condition - Extremely Guarded  Family Communication  :  Per primary  Code Status :  Full  Procedures  :  none  Disposition Plan  :    Status is:  Inpatient  Remains inpatient appropriate because:IV treatments appropriate due to intensity of illness or inability to take PO   Dispo: The patient is from: Home              Anticipated d/c is to: Home              Patient currently is not medically stable to d/c.   Difficult to place patient No      DVT Prophylaxis  :  On heparin GTT  Lab Results  Component Value Date   PLT 197 08/11/2020    Diet :  Diet Order            Diet heart healthy/carb modified Room service appropriate? Yes; Fluid consistency: Thin  Diet effective now                  Inpatient Medications  Scheduled Meds: . amLODipine  5 mg Oral Daily  . aspirin EC  81 mg Oral Daily  . atorvastatin  80 mg Oral Daily  .  empagliflozin  10 mg Oral Daily  . insulin aspart  0-15 Units Subcutaneous TID WC  . insulin aspart protamine- aspart  20 Units Subcutaneous BID WC  . isosorbide mononitrate  30 mg Oral Daily  . lisinopril  40 mg Oral Daily  . multivitamin with minerals  1 tablet Oral Daily  . propranolol  40 mg Oral Daily  . sertraline  100 mg Oral Daily  . traZODone  50 mg Oral QHS   Continuous Infusions: . heparin 1,550 Units/hr (08/09/20 0700)   PRN Meds:.acetaminophen, hydrALAZINE, nitroGLYCERIN, ondansetron (ZOFRAN) IV  Antibiotics  :    Anti-infectives (From admission, onward)   Start     Dose/Rate Route Frequency Ordered Stop   08/10/20 1058  remdesivir 100 mg in sodium chloride 0.9 % 100 mL IVPB       "Followed by" Linked Group Details   100 mg 200 mL/hr over 30 Minutes Intravenous Daily 08/10/20 1058 08/11/20 0946   08/10/20 1000  remdesivir 100 mg in sodium chloride 0.9 % 100 mL IVPB  Status:  Discontinued       "Followed by" Linked Group Details   100 mg 200 mL/hr over 30 Minutes Intravenous Daily 08/09/20 0826 08/10/20 1058   08/09/20 1000  remdesivir 200 mg in sodium chloride 0.9% 250 mL IVPB       "Followed by" Linked Group Details   200 mg 580 mL/hr over 30 Minutes  Intravenous Once 08/09/20 0826 08/09/20 1340       Shadai Mcclane M.D on 08/11/2020 at 11:09 AM  To page go to [email or web address]  [facility] -  Office  [phone]      Objective:   Vitals:   08/10/20 2259 08/11/20 0320 08/11/20 0546 08/11/20 0817  BP: (!) 143/70 (!) 176/68 (!) 162/70 (!) 180/82  Pulse: 82 60  60  Resp: 20 15  15   Temp: 97.6 F (36.4 C) 97.8 F (36.6 C)  98.4 F (36.9 C)  TempSrc: Oral Oral  Oral  SpO2: 100% 100%  92%  Weight:      Height:        Wt Readings from Last 3 Encounters:  08/10/20 109.6 kg  01/04/20 108.7 kg     Intake/Output Summary (Last 24 hours) at 08/11/2020 1109 Last data filed at 08/11/2020 0827 Gross per 24 hour  Intake 285.76 ml  Output 500 ml  Net -214.24 ml     Physical Exam  Awake Alert, No new F.N deficits, Normal affect Symmetrical Chest wall movement, Good air movement bilaterally, CTAB RRR,No Gallops,Rubs or new Murmurs, No Parasternal Heave +ve B.Sounds, Abd Soft, No tenderness,  No rebound - guarding or rigidity. No Cyanosis, Clubbing or edema, No new Rash or bruise      Data Review:    CBC Recent Labs  Lab 08/09/20 0128 08/10/20 0124 08/10/20 0938 08/11/20 0141  WBC 5.8 6.5 6.3 7.5  HGB 12.8* 13.3 14.0 13.4  HCT 38.3* 39.7 42.1 39.9  PLT 198 213 216 197  MCV 86.8 86.7 86.8 87.1  MCH 29.0 29.0 28.9 29.3  MCHC 33.4 33.5 33.3 33.6  RDW 13.7 13.8 13.7 14.0  LYMPHSABS  --   --  1.9  --   MONOABS  --   --  0.7  --   EOSABS  --   --  0.2  --   BASOSABS  --   --  0.0  --     Recent Labs  Lab 08/09/20 0128 08/10/20 0124 08/10/20 3474 08/11/20 0141  NA 135 138  --  137  K 4.4 4.0  --  3.9  CL 102 103  --  106  CO2 27 28  --  23  GLUCOSE 360* 138*  --  205*  BUN 13 11  --  12  CREATININE 1.00 1.02  --  0.84  CALCIUM 9.2 9.4  --  9.3  AST  --  18  --  20  ALT  --  13  --  14  ALKPHOS  --  64  --  64  BILITOT  --  0.7  --  0.8  ALBUMIN  --  3.2*  --  3.1*  MG  --  1.9  --   --    CRP  --   --  0.9  --   DDIMER  --   --  0.74*  --   PROCALCITON  --   --  <0.10  --   INR 1.0  --   --   --   HGBA1C 12.0*  --   --   --     ------------------------------------------------------------------------------------------------------------------ Recent Labs    08/09/20 0128  CHOL 259*  HDL 36*  LDLCALC 162*  TRIG 304*  CHOLHDL 7.2    Lab Results  Component Value Date   HGBA1C 12.0 (H) 08/09/2020   ------------------------------------------------------------------------------------------------------------------ No results for input(s): TSH, T4TOTAL, T3FREE, THYROIDAB in the last 72 hours.  Invalid input(s): FREET3  Cardiac Enzymes No results for input(s): CKMB, TROPONINI, MYOGLOBIN in the last 168 hours.  Invalid input(s): CK ------------------------------------------------------------------------------------------------------------------ No results found for: BNP  Micro Results Recent Results (from the past 240 hour(s))  SARS CORONAVIRUS 2 (TAT 6-24 HRS) Nasopharyngeal Nasopharyngeal Swab     Status: Abnormal   Collection Time: 08/08/20  7:27 PM   Specimen: Nasopharyngeal Swab  Result Value Ref Range Status   SARS Coronavirus 2 POSITIVE (A) NEGATIVE Final    Comment: (NOTE) SARS-CoV-2 target nucleic acids are DETECTED.  The SARS-CoV-2 RNA is generally detectable in upper and lower respiratory specimens during the acute phase of infection. Positive results are indicative of the presence of SARS-CoV-2 RNA. Clinical correlation with patient history and other diagnostic information is  necessary to determine patient infection status. Positive results do not rule out bacterial infection or co-infection with other viruses.  The expected result is Negative.  Fact Sheet for Patients: SugarRoll.be  Fact Sheet for Healthcare Providers: https://www.woods-mathews.com/  This test is not yet approved or cleared by the  [place] FDA and  has been authorized for detection and/or diagnosis of SARS-CoV-2 by FDA under an Emergency Use Authorization (EUA). This EUA will remain  in effect (meaning this test can be used) for the duration of the COVID-19 declaration under Section 564(b)(1) of the Act, 21 U. S.C. section 360bbb-3(b)(1), unless the authorization is terminated or revoked sooner.   Performed at [facility], [place] [address] [place], [place] [address]     Radiology Reports DG Chest 2 View  Result Date: 08/08/2020 CLINICAL DATA:  Chest pain EXAM: CHEST - 2 VIEW COMPARISON:  None. FINDINGS: Cardiac shadow is enlarged. The lungs are clear. No bony abnormality is noted. IMPRESSION: Cardiomegaly without acute abnormality. Electronically Signed   By: Inez Catalina M.D.   On: 08/08/2020 21:40   CT CHEST WO CONTRAST  Result Date: 08/09/2020 CLINICAL DATA:  Thoracic aortic disease, preoperative planning CABG EXAM: CT CHEST WITHOUT CONTRAST TECHNIQUE: Multidetector CT imaging of the chest was performed following the standard protocol  without IV contrast. COMPARISON:  None. FINDINGS: Cardiovascular: Scattered aortic atherosclerosis. Incidental note of bovine type 2 vessel branching pattern of the aortic arch. Normal heart size. Three-vessel coronary artery calcifications and/or stents. No pericardial effusion. The left brachiocephalic vein lies 1.8 cm posterior to the manubrium. The tubular ascending aorta lies 2.1 cm posterior to the sternal body. The right ventricle lies 0.8 cm posterior to the sternal body. Mediastinum/Nodes: No enlarged mediastinal, hilar, or axillary lymph nodes. Thyroid gland, trachea, and esophagus demonstrate no significant findings. Lungs/Pleura: Minimal scarring of the lung bases. No pleural effusion or pneumothorax. Upper Abdomen: No acute abnormality. 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 Nonobstructive superior pole  calculus of the right kidney. Musculoskeletal: No chest wall mass or suspicious bone lesions identified. IMPRESSION: 1. The left brachiocephalic vein lies 1.8 cm posterior to the manubrium. The tubular ascending aorta lies 2.1 cm posterior to the sternal body. The right ventricle lies 0.8 cm posterior to the sternal body. 2. Scattered aortic atherosclerosis. 3. Coronary artery disease. 4. Nonobstructive right nephrolithiasis. Aortic Atherosclerosis (ICD10-I70.0). Electronically Signed   By: Eddie Candle M.D.   On: 08/09/2020 13:36   ECHOCARDIOGRAM COMPLETE  Result Date: 08/09/2020    ECHOCARDIOGRAM REPORT   Patient Name:   RONZELL LABAN Date of Exam: 08/09/2020 Medical Rec #:  [id number]        Height:       73.0 in Accession #:    [id number]       Weight:       242.1 lb Date of Birth:  08/02/1950        BSA:          2.333 m Patient Age:    75 years         BP:           159/94 mmHg Patient Gender: M                HR:           63 bpm. Exam Location:  Inpatient Procedure: 2D Echo, Cardiac Doppler and Color Doppler Indications:     CAD  History:         Patient has no prior history of Echocardiogram examinations.                  CAD; Risk Factors:Diabetes. Covid 19 positive.  Sonographer:     Merrie Roof RDCS Referring Phys:  [place] Diagnosing Phys: Dixie Dials MD IMPRESSIONS  1. Left ventricular ejection fraction, by estimation, is 35 to 40%. The left ventricle has moderately decreased function. The left ventricle demonstrates global hypokinesis. The left ventricular internal cavity size was mildly dilated. There is mild concentric left ventricular hypertrophy. Left ventricular diastolic parameters are consistent with Grade I diastolic dysfunction (impaired relaxation).  2. Right ventricular systolic function is normal. The right ventricular size is normal.  3. Left atrial size was severely dilated.  4. Right atrial size was moderately dilated.  5. The mitral valve is degenerative. Mild mitral  valve regurgitation. No evidence of mitral stenosis.  6. The aortic valve is tricuspid. Aortic valve regurgitation is not visualized. Mild aortic valve sclerosis is present, with no evidence of aortic valve stenosis.  7. The inferior vena cava is normal in size with greater than 50% respiratory variability, suggesting right atrial pressure of 3 mmHg. FINDINGS  Left Ventricle: Left ventricular ejection fraction, by estimation, is 35 to 40%. The left ventricle  has moderately decreased function. The left ventricle demonstrates global hypokinesis. The left ventricular internal cavity size was mildly dilated. There is mild concentric left ventricular hypertrophy. Left ventricular diastolic parameters are consistent with Grade I diastolic dysfunction (impaired relaxation). Right Ventricle: The right ventricular size is normal. No increase in right ventricular wall thickness. Right ventricular systolic function is normal. Left Atrium: Left atrial size was severely dilated. Right Atrium: Right atrial size was moderately dilated. Pericardium: There is no evidence of pericardial effusion. Mitral Valve: The mitral valve is degenerative in appearance. There is mild thickening of the mitral valve leaflet(s). There is mild calcification of the mitral valve leaflet(s). Mild mitral annular calcification. Mild mitral valve regurgitation. No evidence of mitral valve stenosis. Tricuspid Valve: The tricuspid valve is normal in structure. Tricuspid valve regurgitation is mild. Aortic Valve: The aortic valve is tricuspid. Aortic valve regurgitation is not visualized. Mild aortic valve sclerosis is present, with no evidence of aortic valve stenosis. Pulmonic Valve: The pulmonic valve was not well visualized. Pulmonic valve regurgitation is not visualized. Aorta: The aortic root is normal in size and structure. There is minimal (Grade I) atheroma plaque involving the ascending aorta and aortic root. Venous: The inferior vena cava is normal  in size with greater than 50% respiratory variability, suggesting right atrial pressure of 3 mmHg. IAS/Shunts: The interatrial septum was not well visualized.   Diastology LV e' medial:    4.91 cm/s LV E/e' medial:  20.5 LV e' lateral:   6.96 cm/s LV E/e' lateral: 14.4  RIGHT VENTRICLE RV Basal diam:  4.70 cm RV Mid diam:    3.30 cm LEFT ATRIUM              Index       RIGHT ATRIUM           Index LA Vol (A2C):   178.0 ml 76.28 ml/m RA Area:     25.10 cm LA Vol (A4C):   117.0 ml 50.14 ml/m RA Volume:   94.30 ml  40.41 ml/m LA Biplane Vol: 145.0 ml 62.14 ml/m  MITRAL VALVE MV Area (PHT): 6.77 cm MV Decel Time: 112 msec MV E velocity: 100.50 cm/s MV A velocity: 75.00 cm/s MV E/A ratio:  1.34 Dixie Dials MD Electronically signed by Dixie Dials MD Signature Date/Time: 08/09/2020/7:09:45 PM    Final

## 2020-08-11 NOTE — Progress Notes (Signed)
ANTICOAGULATION CONSULT NOTE - Initial Consult  Pharmacy Consult for heparin Indication: chest pain/ACS  No Known Allergies  Patient Measurements: Height: 6\' 1"  (185.4 cm) Weight: 109.6 kg (241 lb 10 oz) IBW/kg (Calculated) : 79.9 Heparin Dosing Weight: 110kg  Vital Signs: Temp: 97.8 F (36.6 C) (03/12 0320) Temp Source: Oral (03/12 0320) BP: 162/70 (03/12 0546) Pulse Rate: 60 (03/12 0320)  Labs: Recent Labs    08/09/20 0128 08/10/20 0124 08/10/20 0938 08/11/20 0141  HGB 12.8* 13.3 14.0 13.4  HCT 38.3* 39.7 42.1 39.9  PLT 198 213 216 197  LABPROT 12.9  --   --   --   INR 1.0  --   --   --   HEPARINUNFRC 0.28* 0.52  --  0.54  CREATININE 1.00 1.02  --  0.84    Estimated Creatinine Clearance: 107.8 mL/min (by C-G formula based on SCr of 0.84 mg/dL).   Medical History: Past Medical History:  Diagnosis Date  . Cancer ([place])   . Diabetes mellitus, type II ([place])   . Hyperlipidemia   . Hypertension     Medications:  Medications Prior to Admission  Medication Sig Dispense Refill Last Dose  . aspirin EC 81 MG tablet Take 81 mg by mouth daily. Swallow whole.   08/07/2020 at Unknown time  . b complex vitamins capsule Take 1 capsule by mouth daily.   08/07/2020 at Unknown time  . cetirizine (ZYRTEC) 10 MG tablet Take 1 tablet by mouth daily.   08/07/2020 at Unknown time  . Cholecalciferol (VITAMIN D3) 50 MCG (2000 UT) TABS Take 1 tablet by mouth daily.   08/07/2020 at Unknown time  . furosemide (LASIX) 40 MG tablet Take 40 mg by mouth daily.   08/07/2020 at Unknown time  . glucose blood (RELION GLUCOSE TEST STRIPS) test strip Use as instructed 100 each 2 unknown at unknown  . insulin NPH-regular Human (70-30) 100 UNIT/ML injection Inject 20 Units into the skin 2 (two) times daily. (Patient taking differently: Inject 20 Units into the skin 2 (two) times daily. Per sliding scale) 30 mL 1 08/07/2020 at Unknown time  . isosorbide dinitrate (ISORDIL) 10 MG tablet Take 10 mg by mouth 2  (two) times daily.   08/07/2020 at Unknown time  . lisinopril (ZESTRIL) 40 MG tablet Take 40 mg by mouth daily.   08/08/2020 at Unknown time  . Multiple Vitamins-Minerals (MENS MULTIVITAMIN PO) Take 1 tablet by mouth daily.   08/07/2020 at Unknown time  . nitroGLYCERIN (NITROSTAT) 0.4 MG SL tablet Place 0.4 mg under the tongue every 5 (five) minutes as needed for chest pain.   unknown at unknown  . propranolol (INDERAL) 40 MG tablet Take 1 tablet by mouth daily.   08/08/2020 at 1000  . sertraline (ZOLOFT) 100 MG tablet Take 1 tablet by mouth daily.   08/07/2020 at Unknown time  . traZODone (DESYREL) 50 MG tablet Take 1 tablet by mouth at bedtime.   08/07/2020 at Unknown time   Scheduled:  . amLODipine  5 mg Oral Daily  . aspirin EC  81 mg Oral Daily  . atorvastatin  80 mg Oral Daily  . insulin aspart  0-15 Units Subcutaneous TID WC  . insulin aspart protamine- aspart  20 Units Subcutaneous BID WC  . lisinopril  40 mg Oral Daily  . multivitamin with minerals  1 tablet Oral Daily  . propranolol  40 mg Oral Daily  . sertraline  100 mg Oral Daily  . traZODone  50 mg Oral  QHS   Infusions:  . heparin 1,550 Units/hr (08/09/20 0700)  . remdesivir 100 mg in NS 100 mL 100 mg (08/10/20 1227)    Assessment: Pt was transferred from [facility] for CP. He is s/p cath>>3VD. He was started on IV heparin there post cath around 12PM. He was transferred for CABG. Heparin has been running at 1000 units/hr.  He is not on anticoagulation prior to admission.   Heparin level is therapeutic this AM. Hgb stable. Awaiting CABG. No signs of overt bleeding noted.    Goal of Therapy:  Heparin level 0.3-0.7 units/ml Monitor platelets by anticoagulation protocol: Yes   Plan:  Continue heparin 1550 units/hr Daily heparin level and CBC Monitor for any signs of bleeding  Norina Buzzard, PharmD PGY1 Pharmacy Resident 08/11/2020 7:35 AM

## 2020-08-11 NOTE — Progress Notes (Signed)
Subjective:  Presently denies any chest pain remains on IV heparin.  Complains of cough and shortness of breath with walking from room.  Objective:  Vital Signs in the last 24 hours: Temp:  [97.6 F (36.4 C)-98.7 F (37.1 C)] 98.4 F (36.9 C) (03/12 0817) Pulse Rate:  [59-82] 60 (03/12 0817) Resp:  [15-20] 15 (03/12 0817) BP: (132-180)/(68-82) 180/82 (03/12 0817) SpO2:  [92 %-100 %] 92 % (03/12 0817)  Intake/Output from previous day: 03/11 0701 - 03/12 0700 In: 285.8 [I.V.:188.6; IV Piggyback:97.2] Out: 600 [Urine:600] Intake/Output from this shift: Total I/O In: -  Out: 200 [Urine:200]  Physical Exam: Neck: no adenopathy, no carotid bruit, no JVD and supple, symmetrical, trachea midline Lungs: clear to auscultation bilaterally Heart: regular rate and rhythm, S1, S2 normal and 2/6 systolic murmur noted Abdomen: soft, non-tender; bowel sounds normal; no masses,  no organomegaly Extremities: extremities normal, atraumatic, no cyanosis or edema  Lab Results: Recent Labs    08/10/20 0938 08/11/20 0141  WBC 6.3 7.5  HGB 14.0 13.4  PLT 216 197   Recent Labs    08/10/20 0124 08/11/20 0141  NA 138 137  K 4.0 3.9  CL 103 106  CO2 28 23  GLUCOSE 138* 205*  BUN 11 12  CREATININE 1.02 0.84   No results for input(s): TROPONINI in the last 72 hours.  Invalid input(s): CK, MB Hepatic Function Panel Recent Labs    08/11/20 0141  PROT 6.4*  ALBUMIN 3.1*  AST 20  ALT 14  ALKPHOS 64  BILITOT 0.8   Recent Labs    08/09/20 0128  CHOL 259*   No results for input(s): PROTIME in the last 72 hours.  Imaging: Imaging results have been reviewed and ECHOCARDIOGRAM COMPLETE  Result Date: 08/09/2020    ECHOCARDIOGRAM REPORT   Patient Name:   Brian Carpenter Date of Exam: 08/09/2020 Medical Rec #:  [id number]        Height:       73.0 in Accession #:    [id number]       Weight:       242.1 lb Date of Birth:  Feb 14, 1951        BSA:          2.333 m Patient Age:    70 years          BP:           159/94 mmHg Patient Gender: M                HR:           63 bpm. Exam Location:  Inpatient Procedure: 2D Echo, Cardiac Doppler and Color Doppler Indications:     CAD  History:         Patient has no prior history of Echocardiogram examinations.                  CAD; Risk Factors:Diabetes. Covid 19 positive.  Sonographer:     Merrie Roof RDCS Referring Phys:  [place] Diagnosing Phys: Dixie Dials MD IMPRESSIONS  1. Left ventricular ejection fraction, by estimation, is 35 to 40%. The left ventricle has moderately decreased function. The left ventricle demonstrates global hypokinesis. The left ventricular internal cavity size was mildly dilated. There is mild concentric left ventricular hypertrophy. Left ventricular diastolic parameters are consistent with Grade I diastolic dysfunction (impaired relaxation).  2. Right ventricular systolic function is normal. The right ventricular size is normal.  3. Left atrial size  was severely dilated.  4. Right atrial size was moderately dilated.  5. The mitral valve is degenerative. Mild mitral valve regurgitation. No evidence of mitral stenosis.  6. The aortic valve is tricuspid. Aortic valve regurgitation is not visualized. Mild aortic valve sclerosis is present, with no evidence of aortic valve stenosis.  7. The inferior vena cava is normal in size with greater than 50% respiratory variability, suggesting right atrial pressure of 3 mmHg. FINDINGS  Left Ventricle: Left ventricular ejection fraction, by estimation, is 35 to 40%. The left ventricle has moderately decreased function. The left ventricle demonstrates global hypokinesis. The left ventricular internal cavity size was mildly dilated. There is mild concentric left ventricular hypertrophy. Left ventricular diastolic parameters are consistent with Grade I diastolic dysfunction (impaired relaxation). Right Ventricle: The right ventricular size is normal. No increase in right ventricular wall  thickness. Right ventricular systolic function is normal. Left Atrium: Left atrial size was severely dilated. Right Atrium: Right atrial size was moderately dilated. Pericardium: There is no evidence of pericardial effusion. Mitral Valve: The mitral valve is degenerative in appearance. There is mild thickening of the mitral valve leaflet(s). There is mild calcification of the mitral valve leaflet(s). Mild mitral annular calcification. Mild mitral valve regurgitation. No evidence of mitral valve stenosis. Tricuspid Valve: The tricuspid valve is normal in structure. Tricuspid valve regurgitation is mild. Aortic Valve: The aortic valve is tricuspid. Aortic valve regurgitation is not visualized. Mild aortic valve sclerosis is present, with no evidence of aortic valve stenosis. Pulmonic Valve: The pulmonic valve was not well visualized. Pulmonic valve regurgitation is not visualized. Aorta: The aortic root is normal in size and structure. There is minimal (Grade I) atheroma plaque involving the ascending aorta and aortic root. Venous: The inferior vena cava is normal in size with greater than 50% respiratory variability, suggesting right atrial pressure of 3 mmHg. IAS/Shunts: The interatrial septum was not well visualized.   Diastology LV e' medial:    4.91 cm/s LV E/e' medial:  20.5 LV e' lateral:   6.96 cm/s LV E/e' lateral: 14.4  RIGHT VENTRICLE RV Basal diam:  4.70 cm RV Mid diam:    3.30 cm LEFT ATRIUM              Index       RIGHT ATRIUM           Index LA Vol (A2C):   178.0 ml 76.28 ml/m RA Area:     25.10 cm LA Vol (A4C):   117.0 ml 50.14 ml/m RA Volume:   94.30 ml  40.41 ml/m LA Biplane Vol: 145.0 ml 62.14 ml/m  MITRAL VALVE MV Area (PHT): 6.77 cm MV Decel Time: 112 msec MV E velocity: 100.50 cm/s MV A velocity: 75.00 cm/s MV E/A ratio:  1.34 Dixie Dials MD Electronically signed by Dixie Dials MD Signature Date/Time: 08/09/2020/7:09:45 PM    Final     Cardiac Studies:  Assessment/Plan:  Acute  coronary syndrome Multivessel CAD Ischemic cardiomyopathy Hypertension Diabetes mellitus Hyperlipidemia Obesity COVID-19 infection Plan Add Imdur 30 mg daily Add Jardiance 10 mg daily Awaiting CVTS consult and possible CABG next week  LOS: 3 days    Charolette Forward 08/11/2020, 10:58 AM

## 2020-08-12 LAB — COMPREHENSIVE METABOLIC PANEL
ALT: 16 U/L (ref 0–44)
AST: 23 U/L (ref 15–41)
Albumin: 2.9 g/dL — ABNORMAL LOW (ref 3.5–5.0)
Alkaline Phosphatase: 60 U/L (ref 38–126)
Anion gap: 8 (ref 5–15)
BUN: 11 mg/dL (ref 8–23)
CO2: 22 mmol/L (ref 22–32)
Calcium: 9 mg/dL (ref 8.9–10.3)
Chloride: 109 mmol/L (ref 98–111)
Creatinine, Ser: 0.97 mg/dL (ref 0.61–1.24)
GFR, Estimated: >60 mL/min (ref >60)
Glucose, Bld: 183 mg/dL — ABNORMAL HIGH (ref 70–99)
Potassium: 3.4 mmol/L — ABNORMAL LOW (ref 3.5–5.1)
Sodium: 139 mmol/L (ref 135–145)
Total Bilirubin: 0.5 mg/dL (ref 0.3–1.2)
Total Protein: 6.1 g/dL — ABNORMAL LOW (ref 6.5–8.1)

## 2020-08-12 LAB — CBC
HCT: 36.7 % — ABNORMAL LOW (ref 39.0–52.0)
Hemoglobin: 12.3 g/dL — ABNORMAL LOW (ref 13.0–17.0)
MCH: 29.2 pg (ref 26.0–34.0)
MCHC: 33.5 g/dL (ref 30.0–36.0)
MCV: 87.2 fL (ref 80.0–100.0)
Platelets: 200 10*3/uL (ref 150–400)
RBC: 4.21 MIL/uL — ABNORMAL LOW (ref 4.22–5.81)
RDW: 14.3 % (ref 11.5–15.5)
WBC: 5.2 10*3/uL (ref 4.0–10.5)
nRBC: 0 % (ref 0.0–0.2)

## 2020-08-12 LAB — GLUCOSE, CAPILLARY
Glucose-Capillary: 121 mg/dL — ABNORMAL HIGH (ref 70–99)
Glucose-Capillary: 102 mg/dL — ABNORMAL HIGH (ref 70–99)
Glucose-Capillary: 108 mg/dL — ABNORMAL HIGH (ref 70–99)
Glucose-Capillary: 108 mg/dL — ABNORMAL HIGH (ref 70–99)

## 2020-08-12 LAB — HEPARIN LEVEL (UNFRACTIONATED): Heparin Unfractionated: 0.35 IU/mL (ref 0.30–0.70)

## 2020-08-12 MED ORDER — POTASSIUM CHLORIDE CRYS ER 20 MEQ PO TBCR
40.0000 meq | EXTENDED_RELEASE_TABLET | Freq: Once | ORAL | Status: AC
  Administered 2020-08-12: 40 meq via ORAL
  Filled 2020-08-12: qty 2

## 2020-08-12 MED ORDER — ISOSORBIDE MONONITRATE ER 60 MG PO TB24
60.0000 mg | ORAL_TABLET | Freq: Every day | ORAL | Status: DC
  Administered 2020-08-12 – 2020-08-15 (×4): 60 mg via ORAL
  Filled 2020-08-12 (×4): qty 1

## 2020-08-12 MED ORDER — CARVEDILOL 3.125 MG PO TABS
3.1250 mg | ORAL_TABLET | Freq: Two times a day (BID) | ORAL | Status: DC
  Administered 2020-08-12 – 2020-08-15 (×6): 3.125 mg via ORAL
  Filled 2020-08-12 (×6): qty 1

## 2020-08-12 NOTE — Progress Notes (Signed)
Subjective:  Patient denies any chest pain or shortness of breath.  States coughing has improved.  Objective:  Vital Signs in the last 24 hours: Temp:  [97.7 F (36.5 C)-98.7 F (37.1 C)] 98.6 F (37 C) (03/13 0749) Pulse Rate:  [58-70] 70 (03/13 0749) Resp:  [13-20] 13 (03/13 0749) BP: (135-183)/(73-101) 135/73 (03/13 0749) SpO2:  [97 %-99 %] 97 % (03/13 0749) Weight:  [109.2 kg] 109.2 kg (03/13 0500)  Intake/Output from previous day: 03/12 0701 - 03/13 0700 In: 580 [P.O.:580] Out: 700 [Urine:700] Intake/Output from this shift: No intake/output data recorded.  Physical Exam: Neck: no adenopathy, no carotid bruit, no JVD and supple, symmetrical, trachea midline Lungs: clear to auscultation bilaterally Heart: regular rate and rhythm, S1, S2 normal and 2/6 systolic murmur noted Abdomen: soft, non-tender; bowel sounds normal; no masses,  no organomegaly Extremities: extremities normal, atraumatic, no cyanosis or edema  Lab Results: Recent Labs    08/11/20 0141 08/12/20 0305  WBC 7.5 5.2  HGB 13.4 12.3*  PLT 197 200   Recent Labs    08/11/20 0141 08/12/20 0305  NA 137 139  K 3.9 3.4*  CL 106 109  CO2 23 22  GLUCOSE 205* 183*  BUN 12 11  CREATININE 0.84 0.97   No results for input(s): TROPONINI in the last 72 hours.  Invalid input(s): CK, MB Hepatic Function Panel Recent Labs    08/12/20 0305  PROT 6.1*  ALBUMIN 2.9*  AST 23  ALT 16  ALKPHOS 60  BILITOT 0.5   No results for input(s): CHOL in the last 72 hours. No results for input(s): PROTIME in the last 72 hours.  Imaging: Imaging results have been reviewed and No results found.  Cardiac Studies:  Assessment/Plan:  Acute coronary syndrome Multivessel CAD Ischemic cardiomyopathy Hypertension Diabetes mellitus Hyperlipidemia Obesity COVID-19 infection Plan Change propranolol to carvedilol as per orders Will consider switching ACE inhibitor to [facility] upon discharge Awaiting CABG  LOS: 4  days    Charolette Forward 08/12/2020, 10:40 AM

## 2020-08-12 NOTE — Progress Notes (Signed)
PROGRESS NOTE                                                                             PROGRESS NOTE                                                                                                                                                                                                             Patient Demographics:    Brian Carpenter, is a 70 y.o. male, DOB - Apr 16, 1951, [id number]  Outpatient Primary MD for the patient is Emelda Fear, DO    LOS - 4  Admit date - 08/08/2020    No chief complaint on file.      Brief Narrative    Brian Carpenter is an 70 y.o. male with h/o HTN; HLD; obesity s/p gastric bypass; and DM presenting as a transfer from [facility] due to multivessel CAD.  He was admitted by Dr. Doylene Canard on 3/9.  He was subsequently found to have COVID on 3/9.  He reports 1-2 weeks of mild intermittent cough.  He developed acute onset of substernal and left-sided chest pain with significant SOB.  He currently feels generally well and without symptoms.    Subjective:    [name] today denies any chest pain, reports mild cough, reports dyspnea with activity .    Assessment  & Plan :    Principal Problem:   Acute coronary syndrome (HCC) Active Problems:   Type 2 diabetes mellitus with hyperglycemia, without long-term current use of insulin (HCC)   Essential hypertension, benign   Mixed hyperlipidemia   Ischemic cardiomyopathy   COVID-19 virus infection    ACS -Patient with substernal chest pain that came on acutely -CXR unremarkable.  -Cath revealed multivessel disease -Dr. Doylene Canard from cardiology admitted the patient and is managing his CAD with CVTS consultation -On Heparin drip  Incidental COVID -Patient with 2 immunizations - recently received his 2nd dose -No apparent current symptoms, although he did have mild intermittent cough x the last 2 weeks -No O2 requirement -Negative CXR,  chest CT -COVID  POSITIVE -The patient has comorbidities which may increase the risk for ARDS/MODS including:  HTN, DM, CAD, CHF, obesity -Pertinent labs concerning for COVID include normal WBC; elevated D-dimer; elevated fibrinogen; other COVID labs are still pending -Continue with remdesivir x3 days  Ischemic cardiomyopathy -Echo with EF 35-40% and grade 1 diastolic dysfunction -Appears to be compensated at this time -Managment per cardiology, propranolol changed to Coreg, per cardiology likely will change lisinopril to [facility] on discharge.  HTN -Continue beta-blocker -Resume ACE vs. Start Entresto when appropriate by cardiology -Blood pressure remains elevated, will increase Imdur from 30 to 60 mg oral daily, continue with as needed hydralazine.  HLD -Started on Lipitor 80 mg daily -Lipids were checked and indicate very poor control with LDL 168  DM -A1c is 12, indicating very poor control -He takes 70/30 at home and this has been continued with SSI -Continue with current dose insulin 70/30, continue with sliding scale, he was started on -started on Jardiance.  Obesity -Body mass index is 31.88 kg/m..  -s/p gastric bypass -Weight loss should be encouraged -Outpatient PCP/bariatric medicine f/u encouraged  Hypokalemia -Potassium 3.4, repleted.  SpO2: 97 %  Recent Labs  Lab 08/08/20 1927 08/09/20 0128 08/10/20 0124 08/10/20 0938 08/11/20 0141 08/12/20 0305  WBC  --  5.8 6.5 6.3 7.5 5.2  PLT  --  198 213 216 197 200  CRP  --   --   --  0.9  --   --   DDIMER  --   --   --  0.74*  --   --   PROCALCITON  --   --   --  <0.10  --   --   AST  --   --  18  --  20 23  ALT  --   --  13  --  14 16  ALKPHOS  --   --  64  --  64 60  BILITOT  --   --  0.7  --  0.8 0.5  ALBUMIN  --   --  3.2*  --  3.1* 2.9*  INR  --  1.0  --   --   --   --   SARSCOV2NAA POSITIVE*  --   --   --   --   --        ABG  No results found for: PHART, PCO2ART, PO2ART, HCO3, TCO2, ACIDBASEDEF,  O2SAT       Condition - Extremely Guarded  Family Communication  :  Per primary  Code Status :  Full  Procedures  :  none  Disposition Plan  :    Status is: Inpatient  Remains inpatient appropriate because:IV treatments appropriate due to intensity of illness or inability to take PO   Dispo: The patient is from: Home              Anticipated d/c is to: Home              Patient currently is not medically stable to d/c.   Difficult to place patient No      DVT Prophylaxis  :  On heparin GTT  Lab Results  Component Value Date   PLT 200 08/12/2020    Diet :  Diet Order            Diet heart healthy/carb modified Room service appropriate? Yes; Fluid consistency: Thin  Diet effective now  Inpatient Medications  Scheduled Meds: . amLODipine  5 mg Oral Daily  . aspirin EC  81 mg Oral Daily  . atorvastatin  80 mg Oral Daily  . carvedilol  3.125 mg Oral BID WC  . empagliflozin  10 mg Oral Daily  . insulin aspart  0-15 Units Subcutaneous TID WC  . insulin aspart protamine- aspart  20 Units Subcutaneous BID WC  . isosorbide mononitrate  60 mg Oral Daily  . lisinopril  40 mg Oral Daily  . multivitamin with minerals  1 tablet Oral Daily  . pantoprazole  40 mg Oral Daily  . sertraline  100 mg Oral Daily  . traZODone  50 mg Oral QHS   Continuous Infusions: . heparin 1,550 Units/hr (08/12/20 1114)   PRN Meds:.acetaminophen, hydrALAZINE, nitroGLYCERIN, ondansetron (ZOFRAN) IV  Antibiotics  :    Anti-infectives (From admission, onward)   Start     Dose/Rate Route Frequency Ordered Stop   08/10/20 1058  remdesivir 100 mg in sodium chloride 0.9 % 100 mL IVPB       "Followed by" Linked Group Details   100 mg 200 mL/hr over 30 Minutes Intravenous Daily 08/10/20 1058 08/11/20 0946   08/10/20 1000  remdesivir 100 mg in sodium chloride 0.9 % 100 mL IVPB  Status:  Discontinued       "Followed by" Linked Group Details   100 mg 200 mL/hr over 30  Minutes Intravenous Daily 08/09/20 0826 08/10/20 1058   08/09/20 1000  remdesivir 200 mg in sodium chloride 0.9% 250 mL IVPB       "Followed by" Linked Group Details   200 mg 580 mL/hr over 30 Minutes Intravenous Once 08/09/20 0826 08/09/20 1340       Cleve Paolillo M.D on 08/12/2020 at 2:20 PM  To page go to [email or web address]  [facility] -  Office  [phone]      Objective:   Vitals:   08/11/20 2347 08/12/20 0500 08/12/20 0749 08/12/20 1056  BP: (!) 155/79 (!) 180/101 135/73 139/69  Pulse: 65 64 70 70  Resp: 20 15 13 19   Temp: 98.7 F (37.1 C) 97.7 F (36.5 C) 98.6 F (37 C) 98.6 F (37 C)  TempSrc: Oral Oral Oral Oral  SpO2: 97% 98% 97% 97%  Weight:  109.2 kg    Height:        Wt Readings from Last 3 Encounters:  08/12/20 109.2 kg  01/04/20 108.7 kg    No intake or output data in the 24 hours ending 08/12/20 1420   Physical Exam  Awake Alert, Oriented X 3, No new F.N deficits, Normal affect Symmetrical Chest wall movement, Good air movement bilaterally, CTAB RRR,No Gallops,Rubs or new Murmurs, No Parasternal Heave +ve B.Sounds, Abd Soft, No tenderness, No rebound - guarding or rigidity. No Cyanosis, Clubbing or edema, No new Rash or bruise     Data Review:    CBC Recent Labs  Lab 08/09/20 0128 08/10/20 0124 08/10/20 0938 08/11/20 0141 08/12/20 0305  WBC 5.8 6.5 6.3 7.5 5.2  HGB 12.8* 13.3 14.0 13.4 12.3*  HCT 38.3* 39.7 42.1 39.9 36.7*  PLT 198 213 216 197 200  MCV 86.8 86.7 86.8 87.1 87.2  MCH 29.0 29.0 28.9 29.3 29.2  MCHC 33.4 33.5 33.3 33.6 33.5  RDW 13.7 13.8 13.7 14.0 14.3  LYMPHSABS  --   --  1.9  --   --   MONOABS  --   --  0.7  --   --   EOSABS  --   --  0.2  --   --   BASOSABS  --   --  0.0  --   --     Recent Labs  Lab 08/09/20 0128 08/10/20 0124 08/10/20 0938 08/11/20 0141 08/12/20 0305  NA 135 138  --  137 139  K 4.4 4.0  --  3.9 3.4*  CL 102 103  --  106 109  CO2 27 28  --  23 22  GLUCOSE 360* 138*  --   205* 183*  BUN 13 11  --  12 11  CREATININE 1.00 1.02  --  0.84 0.97  CALCIUM 9.2 9.4  --  9.3 9.0  AST  --  18  --  20 23  ALT  --  13  --  14 16  ALKPHOS  --  64  --  64 60  BILITOT  --  0.7  --  0.8 0.5  ALBUMIN  --  3.2*  --  3.1* 2.9*  MG  --  1.9  --   --   --   CRP  --   --  0.9  --   --   DDIMER  --   --  0.74*  --   --   PROCALCITON  --   --  <0.10  --   --   INR 1.0  --   --   --   --   HGBA1C 12.0*  --   --   --   --     ------------------------------------------------------------------------------------------------------------------ No results for input(s): CHOL, HDL, LDLCALC, TRIG, CHOLHDL, LDLDIRECT in the last 72 hours.  Lab Results  Component Value Date   HGBA1C 12.0 (H) 08/09/2020   ------------------------------------------------------------------------------------------------------------------ No results for input(s): TSH, T4TOTAL, T3FREE, THYROIDAB in the last 72 hours.  Invalid input(s): FREET3  Cardiac Enzymes No results for input(s): CKMB, TROPONINI, MYOGLOBIN in the last 168 hours.  Invalid input(s): CK ------------------------------------------------------------------------------------------------------------------ No results found for: BNP  Micro Results Recent Results (from the past 240 hour(s))  SARS CORONAVIRUS 2 (TAT 6-24 HRS) Nasopharyngeal Nasopharyngeal Swab     Status: Abnormal   Collection Time: 08/08/20  7:27 PM   Specimen: Nasopharyngeal Swab  Result Value Ref Range Status   SARS Coronavirus 2 POSITIVE (A) NEGATIVE Final    Comment: (NOTE) SARS-CoV-2 target nucleic acids are DETECTED.  The SARS-CoV-2 RNA is generally detectable in upper and lower respiratory specimens during the acute phase of infection. Positive results are indicative of the presence of SARS-CoV-2 RNA. Clinical correlation with patient history and other diagnostic information is  necessary to determine patient infection status. Positive results do not rule out  bacterial infection or co-infection with other viruses.  The expected result is Negative.  Fact Sheet for Patients: SugarRoll.be  Fact Sheet for Healthcare Providers: https://www.woods-mathews.com/  This test is not yet approved or cleared by the [place] FDA and  has been authorized for detection and/or diagnosis of SARS-CoV-2 by FDA under an Emergency Use Authorization (EUA). This EUA will remain  in effect (meaning this test can be used) for the duration of the COVID-19 declaration under Section 564(b)(1) of the Act, 21 U. S.C. section 360bbb-3(b)(1), unless the authorization is terminated or revoked sooner.   Performed at [facility], [place] [address], [place], [place] [address]     Radiology Reports DG Chest 2 View  Result Date: 08/08/2020 CLINICAL DATA:  Chest pain EXAM: CHEST - 2 VIEW COMPARISON:  None. FINDINGS: Cardiac shadow is enlarged. The lungs are  clear. No bony abnormality is noted. IMPRESSION: Cardiomegaly without acute abnormality. Electronically Signed   By: Inez Catalina M.D.   On: 08/08/2020 21:40   CT CHEST WO CONTRAST  Result Date: 08/09/2020 CLINICAL DATA:  Thoracic aortic disease, preoperative planning CABG EXAM: CT CHEST WITHOUT CONTRAST TECHNIQUE: Multidetector CT imaging of the chest was performed following the standard protocol without IV contrast. COMPARISON:  None. FINDINGS: Cardiovascular: Scattered aortic atherosclerosis. Incidental note of bovine type 2 vessel branching pattern of the aortic arch. Normal heart size. Three-vessel coronary artery calcifications and/or stents. No pericardial effusion. The left brachiocephalic vein lies 1.8 cm posterior to the manubrium. The tubular ascending aorta lies 2.1 cm posterior to the sternal body. The right ventricle lies 0.8 cm posterior to the sternal body. Mediastinum/Nodes: No enlarged mediastinal, hilar, or axillary lymph nodes. Thyroid gland, trachea, and  esophagus demonstrate no significant findings. Lungs/Pleura: Minimal scarring of the lung bases. No pleural effusion or pneumothorax. Upper Abdomen: No acute abnormality. 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 Nonobstructive superior pole calculus of the right kidney. Musculoskeletal: No chest wall mass or suspicious bone lesions identified. IMPRESSION: 1. The left brachiocephalic vein lies 1.8 cm posterior to the manubrium. The tubular ascending aorta lies 2.1 cm posterior to the sternal body. The right ventricle lies 0.8 cm posterior to the sternal body. 2. Scattered aortic atherosclerosis. 3. Coronary artery disease. 4. Nonobstructive right nephrolithiasis. Aortic Atherosclerosis (ICD10-I70.0). Electronically Signed   By: Eddie Candle M.D.   On: 08/09/2020 13:36   ECHOCARDIOGRAM COMPLETE  Result Date: 08/09/2020    ECHOCARDIOGRAM REPORT   Patient Name:   GENTLE HOGE Date of Exam: 08/09/2020 Medical Rec #:  [id number]        Height:       73.0 in Accession #:    [id number]       Weight:       242.1 lb Date of Birth:  [birth year]        BSA:          2.333 m Patient Age:    4 years         BP:           159/94 mmHg Patient Gender: M                HR:           63 bpm. Exam Location:  Inpatient Procedure: 2D Echo, Cardiac Doppler and Color Doppler Indications:     CAD  History:         Patient has no prior history of Echocardiogram examinations.                  CAD; Risk Factors:Diabetes. Covid 19 positive.  Sonographer:     Merrie Roof RDCS Referring Phys:  [place] Diagnosing Phys: Dixie Dials MD IMPRESSIONS  1. Left ventricular ejection fraction, by estimation, is 35 to 40%. The left ventricle has moderately decreased function. The left ventricle demonstrates global hypokinesis. The left ventricular internal cavity size was mildly dilated. There is mild concentric left ventricular hypertrophy. Left ventricular diastolic parameters are  consistent with Grade I diastolic dysfunction (impaired relaxation).  2. Right ventricular systolic function is normal. The right ventricular size is normal.  3. Left atrial size was severely dilated.  4. Right atrial size was moderately dilated.  5. The mitral valve is degenerative. Mild mitral valve regurgitation. No evidence of mitral stenosis.  6. The aortic  valve is tricuspid. Aortic valve regurgitation is not visualized. Mild aortic valve sclerosis is present, with no evidence of aortic valve stenosis.  7. The inferior vena cava is normal in size with greater than 50% respiratory variability, suggesting right atrial pressure of 3 mmHg. FINDINGS  Left Ventricle: Left ventricular ejection fraction, by estimation, is 35 to 40%. The left ventricle has moderately decreased function. The left ventricle demonstrates global hypokinesis. The left ventricular internal cavity size was mildly dilated. There is mild concentric left ventricular hypertrophy. Left ventricular diastolic parameters are consistent with Grade I diastolic dysfunction (impaired relaxation). Right Ventricle: The right ventricular size is normal. No increase in right ventricular wall thickness. Right ventricular systolic function is normal. Left Atrium: Left atrial size was severely dilated. Right Atrium: Right atrial size was moderately dilated. Pericardium: There is no evidence of pericardial effusion. Mitral Valve: The mitral valve is degenerative in appearance. There is mild thickening of the mitral valve leaflet(s). There is mild calcification of the mitral valve leaflet(s). Mild mitral annular calcification. Mild mitral valve regurgitation. No evidence of mitral valve stenosis. Tricuspid Valve: The tricuspid valve is normal in structure. Tricuspid valve regurgitation is mild. Aortic Valve: The aortic valve is tricuspid. Aortic valve regurgitation is not visualized. Mild aortic valve sclerosis is present, with no evidence of aortic valve  stenosis. Pulmonic Valve: The pulmonic valve was not well visualized. Pulmonic valve regurgitation is not visualized. Aorta: The aortic root is normal in size and structure. There is minimal (Grade I) atheroma plaque involving the ascending aorta and aortic root. Venous: The inferior vena cava is normal in size with greater than 50% respiratory variability, suggesting right atrial pressure of 3 mmHg. IAS/Shunts: The interatrial septum was not well visualized.   Diastology LV e' medial:    4.91 cm/s LV E/e' medial:  20.5 LV e' lateral:   6.96 cm/s LV E/e' lateral: 14.4  RIGHT VENTRICLE RV Basal diam:  4.70 cm RV Mid diam:    3.30 cm LEFT ATRIUM              Index       RIGHT ATRIUM           Index LA Vol (A2C):   178.0 ml 76.28 ml/m RA Area:     25.10 cm LA Vol (A4C):   117.0 ml 50.14 ml/m RA Volume:   94.30 ml  40.41 ml/m LA Biplane Vol: 145.0 ml 62.14 ml/m  MITRAL VALVE MV Area (PHT): 6.77 cm MV Decel Time: 112 msec MV E velocity: 100.50 cm/s MV A velocity: 75.00 cm/s MV E/A ratio:  1.34 Dixie Dials MD Electronically signed by Dixie Dials MD Signature Date/Time: 08/09/2020/7:09:45 PM    Final

## 2020-08-12 NOTE — Plan of Care (Signed)
Progressing, will continue to monitor.  

## 2020-08-12 NOTE — Progress Notes (Signed)
ANTICOAGULATION CONSULT NOTE - Initial Consult  Pharmacy Consult for heparin Indication: chest pain/ACS  No Known Allergies  Patient Measurements: Height: 6\' 1"  (185.4 cm) Weight: 109.2 kg (240 lb 11.2 oz) IBW/kg (Calculated) : 79.9 Heparin Dosing Weight: 110kg  Vital Signs: Temp: 98.6 F (37 C) (03/13 0749) Temp Source: Oral (03/13 0749) BP: 135/73 (03/13 0749) Pulse Rate: 70 (03/13 0749)  Labs: Recent Labs    08/10/20 0124 08/10/20 0938 08/11/20 0141 08/12/20 0305  HGB 13.3 14.0 13.4 12.3*  HCT 39.7 42.1 39.9 36.7*  PLT 213 216 197 200  HEPARINUNFRC 0.52  --  0.54 0.35  CREATININE 1.02  --  0.84 0.97    Estimated Creatinine Clearance: 93.1 mL/min (by C-G formula based on SCr of 0.97 mg/dL).   Medical History: Past Medical History:  Diagnosis Date  . Cancer ([place])   . Diabetes mellitus, type II ([place])   . Hyperlipidemia   . Hypertension     Medications:  Medications Prior to Admission  Medication Sig Dispense Refill Last Dose  . aspirin EC 81 MG tablet Take 81 mg by mouth daily. Swallow whole.   08/07/2020 at Unknown time  . b complex vitamins capsule Take 1 capsule by mouth daily.   08/07/2020 at Unknown time  . cetirizine (ZYRTEC) 10 MG tablet Take 1 tablet by mouth daily.   08/07/2020 at Unknown time  . Cholecalciferol (VITAMIN D3) 50 MCG (2000 UT) TABS Take 1 tablet by mouth daily.   08/07/2020 at Unknown time  . furosemide (LASIX) 40 MG tablet Take 40 mg by mouth daily.   08/07/2020 at Unknown time  . glucose blood (RELION GLUCOSE TEST STRIPS) test strip Use as instructed 100 each 2 unknown at unknown  . insulin NPH-regular Human (70-30) 100 UNIT/ML injection Inject 20 Units into the skin 2 (two) times daily. (Patient taking differently: Inject 20 Units into the skin 2 (two) times daily. Per sliding scale) 30 mL 1 08/07/2020 at Unknown time  . isosorbide dinitrate (ISORDIL) 10 MG tablet Take 10 mg by mouth 2 (two) times daily.   08/07/2020 at Unknown time  . lisinopril  (ZESTRIL) 40 MG tablet Take 40 mg by mouth daily.   08/08/2020 at Unknown time  . Multiple Vitamins-Minerals (MENS MULTIVITAMIN PO) Take 1 tablet by mouth daily.   08/07/2020 at Unknown time  . nitroGLYCERIN (NITROSTAT) 0.4 MG SL tablet Place 0.4 mg under the tongue every 5 (five) minutes as needed for chest pain.   unknown at unknown  . propranolol (INDERAL) 40 MG tablet Take 1 tablet by mouth daily.   08/08/2020 at 1000  . sertraline (ZOLOFT) 100 MG tablet Take 1 tablet by mouth daily.   08/07/2020 at Unknown time  . traZODone (DESYREL) 50 MG tablet Take 1 tablet by mouth at bedtime.   08/07/2020 at Unknown time   Scheduled:  . amLODipine  5 mg Oral Daily  . aspirin EC  81 mg Oral Daily  . atorvastatin  80 mg Oral Daily  . empagliflozin  10 mg Oral Daily  . insulin aspart  0-15 Units Subcutaneous TID WC  . insulin aspart protamine- aspart  20 Units Subcutaneous BID WC  . isosorbide mononitrate  60 mg Oral Daily  . lisinopril  40 mg Oral Daily  . multivitamin with minerals  1 tablet Oral Daily  . pantoprazole  40 mg Oral Daily  . propranolol  40 mg Oral Daily  . sertraline  100 mg Oral Daily  . traZODone  50 mg  Oral QHS   Infusions:  . heparin 1,550 Units/hr (08/09/20 0700)    Assessment: Pt was transferred from [facility] health for CP. He is s/p cath>>3VD. He was started on IV heparin post cath. He was transferred for CABG. He is not on anticoagulation prior to admission. Pharmacy consulted for heparin.  Heparin level remains therapeutic this AM. Hgb with mild drop, PLT stable. Awaiting CABG. No signs of overt bleeding noted.    Goal of Therapy:  Heparin level 0.3-0.7 units/ml Monitor platelets by anticoagulation protocol: Yes   Plan:  Continue heparin 1550 units/hr Daily heparin level and CBC Monitor for any signs of bleeding  Norina Buzzard, PharmD PGY1 Pharmacy Resident 08/12/2020 9:03 AM

## 2020-08-13 LAB — GLUCOSE, CAPILLARY
Glucose-Capillary: 110 mg/dL — ABNORMAL HIGH (ref 70–99)
Glucose-Capillary: 117 mg/dL — ABNORMAL HIGH (ref 70–99)
Glucose-Capillary: 133 mg/dL — ABNORMAL HIGH (ref 70–99)
Glucose-Capillary: 181 mg/dL — ABNORMAL HIGH (ref 70–99)
Glucose-Capillary: 65 mg/dL — ABNORMAL LOW (ref 70–99)

## 2020-08-13 LAB — CBC
HCT: 35.5 % — ABNORMAL LOW (ref 39.0–52.0)
Hemoglobin: 11.9 g/dL — ABNORMAL LOW (ref 13.0–17.0)
MCH: 29.5 pg (ref 26.0–34.0)
MCHC: 33.5 g/dL (ref 30.0–36.0)
MCV: 88.1 fL (ref 80.0–100.0)
Platelets: 188 10*3/uL (ref 150–400)
RBC: 4.03 MIL/uL — ABNORMAL LOW (ref 4.22–5.81)
RDW: 14.4 % (ref 11.5–15.5)
WBC: 6.9 10*3/uL (ref 4.0–10.5)
nRBC: 0 % (ref 0.0–0.2)

## 2020-08-13 LAB — HEPARIN LEVEL (UNFRACTIONATED): Heparin Unfractionated: 0.8 IU/mL — ABNORMAL HIGH (ref 0.30–0.70)

## 2020-08-13 MED ORDER — AMLODIPINE BESYLATE 10 MG PO TABS
10.0000 mg | ORAL_TABLET | Freq: Every day | ORAL | Status: DC
  Administered 2020-08-13 – 2020-08-15 (×3): 10 mg via ORAL
  Filled 2020-08-13 (×3): qty 1

## 2020-08-13 NOTE — Progress Notes (Deleted)
Multiple calls to  [facility] CN, [facility], Dr. Diona Fanti and Dr. Jeffie Pollock. Patient with continuous bladder irrigation returning bright pink irrigant. This RN confirmed that irrigation and manual irrigation were normal as this is not common of this unit. Pt complaining of pain and wanting to have foley irrigated every 30-45 minutes. PA at shift change and MD a couple hours later aggressively irrigated foley and instructed this RN to irrigate more forcefully. New orders to start alum. Pt resting with call bell within reach.  Will continue to monitor.

## 2020-08-13 NOTE — Progress Notes (Signed)
Subjective:  Patient denies any chest pain states breathing and coughing has improved remains on IV heparin tolerating it well  Objective:  Vital Signs in the last 24 hours: Temp:  [97.8 F (36.6 C)-98.4 F (36.9 C)] 98.1 F (36.7 C) (03/14 1246) Pulse Rate:  [55-63] 63 (03/14 1246) Resp:  [12-20] 16 (03/14 0817) BP: (132-170)/(71-85) 158/82 (03/14 0817) SpO2:  [96 %-98 %] 97 % (03/14 0817) Weight:  [107.8 kg] 107.8 kg (03/14 0631)  Intake/Output from previous day: 03/13 0701 - 03/14 0700 In: 480 [P.O.:480] Out: 1600 [Urine:1600] Intake/Output from this shift: Total I/O In: -  Out: 275 [Urine:275]  Physical Exam: Neck: no adenopathy, no carotid bruit, no JVD and supple, symmetrical, trachea midline Lungs: clear to auscultation bilaterally Heart: regular rate and rhythm, S1, S2 normal and 2/6 systolic murmur noted Abdomen: soft, non-tender; bowel sounds normal; no masses,  no organomegaly Extremities: extremities normal, atraumatic, no cyanosis or edema  Lab Results: Recent Labs    08/12/20 0305 08/13/20 0219  WBC 5.2 6.9  HGB 12.3* 11.9*  PLT 200 188   Recent Labs    08/11/20 0141 08/12/20 0305  NA 137 139  K 3.9 3.4*  CL 106 109  CO2 23 22  GLUCOSE 205* 183*  BUN 12 11  CREATININE 0.84 0.97   No results for input(s): TROPONINI in the last 72 hours.  Invalid input(s): CK, MB Hepatic Function Panel Recent Labs    08/12/20 0305  PROT 6.1*  ALBUMIN 2.9*  AST 23  ALT 16  ALKPHOS 60  BILITOT 0.5   No results for input(s): CHOL in the last 72 hours. No results for input(s): PROTIME in the last 72 hours.  Imaging: Imaging results have been reviewed and No results found.  Cardiac Studies:  Assessment/Plan:  Acute coronary syndrome Multivessel CAD Ischemic cardiomyopathy Hypertension Diabetes mellitus Hyperlipidemia Obesity COVID-19 infection Plan Continue present management Encourage incentive spirometry  LOS: 5 days    Charolette Forward 08/13/2020, 12:51 PM

## 2020-08-13 NOTE — Progress Notes (Signed)
[place] for heparin Indication: chest pain/ACS  No Known Allergies  Patient Measurements: Height: 6\' 1"  (185.4 cm) Weight: 107.8 kg (237 lb 10.5 oz) IBW/kg (Calculated) : 79.9 Heparin Dosing Weight: 110kg  Vital Signs: Temp: 97.8 F (36.6 C) (03/14 0817) Temp Source: Oral (03/14 0817) BP: 158/82 (03/14 0817) Pulse Rate: 55 (03/14 0817)  Labs: Recent Labs    08/11/20 0141 08/12/20 0305 08/13/20 0219  HGB 13.4 12.3* 11.9*  HCT 39.9 36.7* 35.5*  PLT 197 200 188  HEPARINUNFRC 0.54 0.35 0.80*  CREATININE 0.84 0.97  --     Estimated Creatinine Clearance: 92.6 mL/min (by C-G formula based on SCr of 0.97 mg/dL).   Medical History: Past Medical History:  Diagnosis Date  . Cancer ([place])   . Diabetes mellitus, type II ([place])   . Hyperlipidemia   . Hypertension     Medications:  Medications Prior to Admission  Medication Sig Dispense Refill Last Dose  . aspirin EC 81 MG tablet Take 81 mg by mouth daily. Swallow whole.   08/07/2020 at Unknown time  . b complex vitamins capsule Take 1 capsule by mouth daily.   08/07/2020 at Unknown time  . cetirizine (ZYRTEC) 10 MG tablet Take 1 tablet by mouth daily.   08/07/2020 at Unknown time  . Cholecalciferol (VITAMIN D3) 50 MCG (2000 UT) TABS Take 1 tablet by mouth daily.   08/07/2020 at Unknown time  . furosemide (LASIX) 40 MG tablet Take 40 mg by mouth daily.   08/07/2020 at Unknown time  . glucose blood (RELION GLUCOSE TEST STRIPS) test strip Use as instructed 100 each 2 unknown at unknown  . insulin NPH-regular Human (70-30) 100 UNIT/ML injection Inject 20 Units into the skin 2 (two) times daily. (Patient taking differently: Inject 20 Units into the skin 2 (two) times daily. Per sliding scale) 30 mL 1 08/07/2020 at Unknown time  . isosorbide dinitrate (ISORDIL) 10 MG tablet Take 10 mg by mouth 2 (two) times daily.   08/07/2020 at Unknown time  . lisinopril (ZESTRIL) 40 MG tablet Take 40 mg by mouth daily.    08/08/2020 at Unknown time  . Multiple Vitamins-Minerals (MENS MULTIVITAMIN PO) Take 1 tablet by mouth daily.   08/07/2020 at Unknown time  . nitroGLYCERIN (NITROSTAT) 0.4 MG SL tablet Place 0.4 mg under the tongue every 5 (five) minutes as needed for chest pain.   unknown at unknown  . propranolol (INDERAL) 40 MG tablet Take 1 tablet by mouth daily.   08/08/2020 at 1000  . sertraline (ZOLOFT) 100 MG tablet Take 1 tablet by mouth daily.   08/07/2020 at Unknown time  . traZODone (DESYREL) 50 MG tablet Take 1 tablet by mouth at bedtime.   08/07/2020 at Unknown time   Scheduled:  . amLODipine  10 mg Oral Daily  . aspirin EC  81 mg Oral Daily  . atorvastatin  80 mg Oral Daily  . carvedilol  3.125 mg Oral BID WC  . empagliflozin  10 mg Oral Daily  . insulin aspart  0-15 Units Subcutaneous TID WC  . insulin aspart protamine- aspart  20 Units Subcutaneous BID WC  . isosorbide mononitrate  60 mg Oral Daily  . lisinopril  40 mg Oral Daily  . multivitamin with minerals  1 tablet Oral Daily  . pantoprazole  40 mg Oral Daily  . sertraline  100 mg Oral Daily  . traZODone  50 mg Oral QHS   Infusions:  . heparin 1,550 Units/hr (08/12/20  1114)    Assessment: Pt was transferred from [facility] health for CP. He is s/p cath>>3VD. He was started on IV heparin post cath. He was transferred for CABG. He is not on anticoagulation prior to admission. Pharmacy consulted for heparin.  Heparin level above goal this morning at 0.8, CBC remains stable, CABG plans pending.  Goal of Therapy:  Heparin level 0.3-0.7 units/ml Monitor platelets by anticoagulation protocol: Yes   Plan:  Reduce heparin to 1450 units/h Daily heparin level and CBC  Arrie Senate, PharmD, [place], [facility] Clinical Pharmacist [phone] Please check AMION for all [facility] numbers 08/13/2020

## 2020-08-13 NOTE — Care Management Important Message (Addendum)
Important Message  Patient Details  Name: Brian Carpenter MRN: [id number] Date of Birth: 10/27/1950   Medicare Important Message Given:  Yes  Pt. On Airborne  And Contact precautions.     Holli Humbles Smith 08/13/2020, 10:09 AM

## 2020-08-13 NOTE — TOC Benefit Eligibility Note (Signed)
Transition of Care [facility]) Benefit Eligibility Note    Patient Details  Name: Chaston Bradburn MRN: [id number] Date of Birth: July 19, 1950   Medication/Dose: Vania Rea and Delene Loll 49/51 mg. bid  Covered?: Yes  Tier: 3 Drug  Prescription Coverage Preferred Pharmacy: [facility] with Person/Company/Phone Number:: Okey Dupre w/[facility] ph# [phone]  Co-Pay: &47.00  Prior Approval: No  Deductible:  (no deductible)       Shelda Altes Phone Number: 08/13/2020, 4:30 PM

## 2020-08-13 NOTE — Progress Notes (Signed)
PROGRESS NOTE                                                                             PROGRESS NOTE                                                                                                                                                                                                             Patient Demographics:    Brian Carpenter, is a 70 y.o. male, DOB - July 21, 1950, OVF:[id number]  Outpatient Primary MD for the patient is Emelda Fear, DO    LOS - 5  Admit date - 08/08/2020    No chief complaint on file.      Brief Narrative    Brian Carpenter is an 70 y.o. male with h/o HTN; HLD; obesity s/p gastric bypass; and DM presenting as a transfer from [facility] due to multivessel CAD.  He was admitted by Dr. Doylene Canard on 3/9.  He was subsequently found to have COVID on 3/9.  He reports 1-2 weeks of mild intermittent cough.  He developed acute onset of substernal and left-sided chest pain with significant SOB.  He currently feels generally well and without symptoms.    Subjective:    Brian Carpenter today denies any chest pain, reports mild cough, reports dyspnea with activity .    Assessment  & Plan :    Principal Problem:   Acute coronary syndrome (HCC) Active Problems:   Type 2 diabetes mellitus with hyperglycemia, without long-term current use of insulin (HCC)   Essential hypertension, benign   Mixed hyperlipidemia   Ischemic cardiomyopathy   COVID-19 virus infection    ACS -Patient with substernal chest pain that came on acutely -CXR unremarkable.  -Cath revealed multivessel disease -Dr. Doylene Canard from cardiology admitted the patient and is managing his CAD -On Heparin drip -Was seen by cardiothoracic surgery team,  he needs to  finish his 10 days isolation before proceeding with CABG surgery.  Incidental COVID -Patient with 2 immunizations - recently received his 2nd dose -Treated with 3  days of remdesivir.  Ischemic  cardiomyopathy -Echo with EF 35-40% and grade 1 diastolic dysfunction -Appears to be compensated at this time -Managment per cardiology, propranolol changed to Coreg, per cardiology likely will change lisinopril to [facility] on discharge.  HTN -Continue beta-blocker -Resume ACE vs. Start Entresto when appropriate by cardiology -Blood pressure remains elevated at increasing his Imdur, will go ahead and increase his Norvasc from 5 to 10 mg oral daily .  HLD -Started on Lipitor 80 mg daily -Lipids were checked and indicate very poor control with LDL 168  DM -A1c is 12, indicating very poor control -He takes 70/30 at home and this has been continued with SSI -Continue with current dose insulin 70/30, continue with sliding scale, he was started on -started on Jardiance.  Obesity -Body mass index is 31.88 kg/m..  -s/p gastric bypass -Weight loss should be encouraged -Outpatient PCP/bariatric medicine f/u encouraged  Hypokalemia - repleted.  SpO2: 97 %  Recent Labs  Lab 08/08/20 1927 08/09/20 0128 08/09/20 0128 08/10/20 0124 08/10/20 0938 08/11/20 0141 08/12/20 0305 08/13/20 0219  WBC  --  5.8   < > 6.5 6.3 7.5 5.2 6.9  PLT  --  198   < > 213 216 197 200 188  CRP  --   --   --   --  0.9  --   --   --   DDIMER  --   --   --   --  0.74*  --   --   --   PROCALCITON  --   --   --   --  <0.10  --   --   --   AST  --   --   --  18  --  20 23  --   ALT  --   --   --  13  --  14 16  --   ALKPHOS  --   --   --  64  --  64 60  --   BILITOT  --   --   --  0.7  --  0.8 0.5  --   ALBUMIN  --   --   --  3.2*  --  3.1* 2.9*  --   INR  --  1.0  --   --   --   --   --   --   SARSCOV2NAA POSITIVE*  --   --   --   --   --   --   --    < > = values in this interval not displayed.       ABG  No results found for: PHART, PCO2ART, PO2ART, HCO3, TCO2, ACIDBASEDEF, O2SAT    Condition - Extremely Guarded  Family Communication  :  Per primary  Code Status :  Full  Procedures   :  none  Disposition Plan  :    Status is: Inpatient  Remains inpatient appropriate because:IV treatments appropriate due to intensity of illness or inability to take PO   Dispo: The patient is from: Home              Anticipated d/c is to: Home              Patient currently is not medically stable to d/c.   Difficult to place patient No      DVT Prophylaxis  :  On heparin GTT  Lab Results  Component Value Date   PLT 188 08/13/2020  Diet :  Diet Order            Diet heart healthy/carb modified Room service appropriate? Yes; Fluid consistency: Thin  Diet effective now                  Inpatient Medications  Scheduled Meds: . amLODipine  10 mg Oral Daily  . aspirin EC  81 mg Oral Daily  . atorvastatin  80 mg Oral Daily  . carvedilol  3.125 mg Oral BID WC  . empagliflozin  10 mg Oral Daily  . insulin aspart  0-15 Units Subcutaneous TID WC  . insulin aspart protamine- aspart  20 Units Subcutaneous BID WC  . isosorbide mononitrate  60 mg Oral Daily  . lisinopril  40 mg Oral Daily  . multivitamin with minerals  1 tablet Oral Daily  . pantoprazole  40 mg Oral Daily  . sertraline  100 mg Oral Daily  . traZODone  50 mg Oral QHS   Continuous Infusions: . heparin 1,550 Units/hr (08/12/20 1114)   PRN Meds:.acetaminophen, hydrALAZINE, nitroGLYCERIN, ondansetron (ZOFRAN) IV  Antibiotics  :    Anti-infectives (From admission, onward)   Start     Dose/Rate Route Frequency Ordered Stop   08/10/20 1058  remdesivir 100 mg in sodium chloride 0.9 % 100 mL IVPB       "Followed by" Linked Group Details   100 mg 200 mL/hr over 30 Minutes Intravenous Daily 08/10/20 1058 08/11/20 0946   08/10/20 1000  remdesivir 100 mg in sodium chloride 0.9 % 100 mL IVPB  Status:  Discontinued       "Followed by" Linked Group Details   100 mg 200 mL/hr over 30 Minutes Intravenous Daily 08/09/20 0826 08/10/20 1058   08/09/20 1000  remdesivir 200 mg in sodium chloride 0.9% 250 mL IVPB        "Followed by" Linked Group Details   200 mg 580 mL/hr over 30 Minutes Intravenous Once 08/09/20 0826 08/09/20 1340       Wania Longstreth M.D on 08/13/2020 at 11:03 AM  To page go to [email or web address]  [facility] -  Office  [phone]      Objective:   Vitals:   08/13/20 0051 08/13/20 0301 08/13/20 0631 08/13/20 0817  BP: (!) 161/75 (!) 141/77 (!) 170/85 (!) 158/82  Pulse: (!) 58 (!) 56 61 (!) 55  Resp: 17 15 12 16   Temp: 98.3 F (36.8 C) 98.4 F (36.9 C) 98 F (36.7 C) 97.8 F (36.6 C)  TempSrc: Oral Oral Oral Oral  SpO2: 98% 96% 98% 97%  Weight:   107.8 kg   Height:        Wt Readings from Last 3 Encounters:  08/13/20 107.8 kg  01/04/20 108.7 kg     Intake/Output Summary (Last 24 hours) at 08/13/2020 1103 Last data filed at 08/13/2020 0900 Gross per 24 hour  Intake 240 ml  Output 1875 ml  Net -1635 ml     Physical Exam  Awake Alert, Oriented X 3, No new F.N deficits, Normal affect Symmetrical Chest wall movement, Good air movement bilaterally, CTAB RRR,No Gallops,Rubs or new Murmurs, No Parasternal Heave +ve B.Sounds, Abd Soft, No tenderness, No rebound - guarding or rigidity. No Cyanosis, Clubbing or edema, No new Rash or bruise     Data Review:    CBC Recent Labs  Lab 08/10/20 0124 08/10/20 0938 08/11/20 0141 08/12/20 0305 08/13/20 0219  WBC 6.5 6.3 7.5 5.2 6.9  HGB 13.3 14.0 13.4 12.3* 11.9*  HCT 39.7 42.1 39.9 36.7* 35.5*  PLT 213 216 197 200 188  MCV 86.7 86.8 87.1 87.2 88.1  MCH 29.0 28.9 29.3 29.2 29.5  MCHC 33.5 33.3 33.6 33.5 33.5  RDW 13.8 13.7 14.0 14.3 14.4  LYMPHSABS  --  1.9  --   --   --   MONOABS  --  0.7  --   --   --   EOSABS  --  0.2  --   --   --   BASOSABS  --  0.0  --   --   --     Recent Labs  Lab 08/09/20 0128 08/10/20 0124 08/10/20 0938 08/11/20 0141 08/12/20 0305  NA 135 138  --  137 139  K 4.4 4.0  --  3.9 3.4*  CL 102 103  --  106 109  CO2 27 28  --  23 22  GLUCOSE 360* 138*  --  205*  183*  BUN 13 11  --  12 11  CREATININE 1.00 1.02  --  0.84 0.97  CALCIUM 9.2 9.4  --  9.3 9.0  AST  --  18  --  20 23  ALT  --  13  --  14 16  ALKPHOS  --  64  --  64 60  BILITOT  --  0.7  --  0.8 0.5  ALBUMIN  --  3.2*  --  3.1* 2.9*  MG  --  1.9  --   --   --   CRP  --   --  0.9  --   --   DDIMER  --   --  0.74*  --   --   PROCALCITON  --   --  <0.10  --   --   INR 1.0  --   --   --   --   HGBA1C 12.0*  --   --   --   --     ------------------------------------------------------------------------------------------------------------------ No results for input(s): CHOL, HDL, LDLCALC, TRIG, CHOLHDL, LDLDIRECT in the last 72 hours.  Lab Results  Component Value Date   HGBA1C 12.0 (H) 08/09/2020   ------------------------------------------------------------------------------------------------------------------ No results for input(s): TSH, T4TOTAL, T3FREE, THYROIDAB in the last 72 hours.  Invalid input(s): FREET3  Cardiac Enzymes No results for input(s): CKMB, TROPONINI, MYOGLOBIN in the last 168 hours.  Invalid input(s): CK ------------------------------------------------------------------------------------------------------------------ No results found for: BNP  Micro Results Recent Results (from the past 240 hour(s))  SARS CORONAVIRUS 2 (TAT 6-24 HRS) Nasopharyngeal Nasopharyngeal Swab     Status: Abnormal   Collection Time: 08/08/20  7:27 PM   Specimen: Nasopharyngeal Swab  Result Value Ref Range Status   SARS Coronavirus 2 POSITIVE (A) NEGATIVE Final    Comment: (NOTE) SARS-CoV-2 target nucleic acids are DETECTED.  The SARS-CoV-2 RNA is generally detectable in upper and lower respiratory specimens during the acute phase of infection. Positive results are indicative of the presence of SARS-CoV-2 RNA. Clinical correlation with patient history and other diagnostic information is  necessary to determine patient infection status. Positive results do not rule out  bacterial infection or co-infection with other viruses.  The expected result is Negative.  Fact Sheet for Patients: SugarRoll.be  Fact Sheet for Healthcare Providers: https://www.woods-mathews.com/  This test is not yet approved or cleared by the [place] FDA and  has been authorized for detection and/or diagnosis of SARS-CoV-2 by FDA under an Emergency Use Authorization (EUA). This EUA will remain  in effect (meaning this test  can be used) for the duration of the COVID-19 declaration under Section 564(b)(1) of the Act, 21 U. S.C. section 360bbb-3(b)(1), unless the authorization is terminated or revoked sooner.   Performed at [facility], [place] [address] [place], [place] [address]     Radiology Reports DG Chest 2 View  Result Date: 08/08/2020 CLINICAL DATA:  Chest pain EXAM: CHEST - 2 VIEW COMPARISON:  None. FINDINGS: Cardiac shadow is enlarged. The lungs are clear. No bony abnormality is noted. IMPRESSION: Cardiomegaly without acute abnormality. Electronically Signed   By: Inez Catalina M.D.   On: 08/08/2020 21:40   CT CHEST WO CONTRAST  Result Date: 08/09/2020 CLINICAL DATA:  Thoracic aortic disease, preoperative planning CABG EXAM: CT CHEST WITHOUT CONTRAST TECHNIQUE: Multidetector CT imaging of the chest was performed following the standard protocol without IV contrast. COMPARISON:  None. FINDINGS: Cardiovascular: Scattered aortic atherosclerosis. Incidental note of bovine type 2 vessel branching pattern of the aortic arch. Normal heart size. Three-vessel coronary artery calcifications and/or stents. No pericardial effusion. The left brachiocephalic vein lies 1.8 cm posterior to the manubrium. The tubular ascending aorta lies 2.1 cm posterior to the sternal body. The right ventricle lies 0.8 cm posterior to the sternal body. Mediastinum/Nodes: No enlarged mediastinal, hilar, or axillary lymph nodes. Thyroid gland, trachea, and  esophagus demonstrate no significant findings. Lungs/Pleura: Minimal scarring of the lung bases. No pleural effusion or pneumothorax. Upper Abdomen: No acute abnormality. 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 Nonobstructive superior pole calculus of the right kidney. Musculoskeletal: No chest wall mass or suspicious bone lesions identified. IMPRESSION: 1. The left brachiocephalic vein lies 1.8 cm posterior to the manubrium. The tubular ascending aorta lies 2.1 cm posterior to the sternal body. The right ventricle lies 0.8 cm posterior to the sternal body. 2. Scattered aortic atherosclerosis. 3. Coronary artery disease. 4. Nonobstructive right nephrolithiasis. Aortic Atherosclerosis (ICD10-I70.0). Electronically Signed   By: Eddie Candle M.D.   On: 08/09/2020 13:36   ECHOCARDIOGRAM COMPLETE  Result Date: 08/09/2020    ECHOCARDIOGRAM REPORT   Patient Name:   CINSERE MIZRAHI Date of Exam: 08/09/2020 Medical Rec #:  [id number]        Height:       73.0 in Accession #:    [id number]       Weight:       242.1 lb Date of Birth:  1950/08/10        BSA:          2.333 m Patient Age:    73 years         BP:           159/94 mmHg Patient Gender: M                HR:           63 bpm. Exam Location:  Inpatient Procedure: 2D Echo, Cardiac Doppler and Color Doppler Indications:     CAD  History:         Patient has no prior history of Echocardiogram examinations.                  CAD; Risk Factors:Diabetes. Covid 19 positive.  Sonographer:     Merrie Roof RDCS Referring Phys:  [place] Diagnosing Phys: Dixie Dials MD IMPRESSIONS  1. Left ventricular ejection fraction, by estimation, is 35 to 40%. The left ventricle has moderately decreased function. The left ventricle demonstrates global hypokinesis. The  left ventricular internal cavity size was mildly dilated. There is mild concentric left ventricular hypertrophy. Left ventricular diastolic parameters are  consistent with Grade I diastolic dysfunction (impaired relaxation).  2. Right ventricular systolic function is normal. The right ventricular size is normal.  3. Left atrial size was severely dilated.  4. Right atrial size was moderately dilated.  5. The mitral valve is degenerative. Mild mitral valve regurgitation. No evidence of mitral stenosis.  6. The aortic valve is tricuspid. Aortic valve regurgitation is not visualized. Mild aortic valve sclerosis is present, with no evidence of aortic valve stenosis.  7. The inferior vena cava is normal in size with greater than 50% respiratory variability, suggesting right atrial pressure of 3 mmHg. FINDINGS  Left Ventricle: Left ventricular ejection fraction, by estimation, is 35 to 40%. The left ventricle has moderately decreased function. The left ventricle demonstrates global hypokinesis. The left ventricular internal cavity size was mildly dilated. There is mild concentric left ventricular hypertrophy. Left ventricular diastolic parameters are consistent with Grade I diastolic dysfunction (impaired relaxation). Right Ventricle: The right ventricular size is normal. No increase in right ventricular wall thickness. Right ventricular systolic function is normal. Left Atrium: Left atrial size was severely dilated. Right Atrium: Right atrial size was moderately dilated. Pericardium: There is no evidence of pericardial effusion. Mitral Valve: The mitral valve is degenerative in appearance. There is mild thickening of the mitral valve leaflet(s). There is mild calcification of the mitral valve leaflet(s). Mild mitral annular calcification. Mild mitral valve regurgitation. No evidence of mitral valve stenosis. Tricuspid Valve: The tricuspid valve is normal in structure. Tricuspid valve regurgitation is mild. Aortic Valve: The aortic valve is tricuspid. Aortic valve regurgitation is not visualized. Mild aortic valve sclerosis is present, with no evidence of aortic valve  stenosis. Pulmonic Valve: The pulmonic valve was not well visualized. Pulmonic valve regurgitation is not visualized. Aorta: The aortic root is normal in size and structure. There is minimal (Grade I) atheroma plaque involving the ascending aorta and aortic root. Venous: The inferior vena cava is normal in size with greater than 50% respiratory variability, suggesting right atrial pressure of 3 mmHg. IAS/Shunts: The interatrial septum was not well visualized.   Diastology LV e' medial:    4.91 cm/s LV E/e' medial:  20.5 LV e' lateral:   6.96 cm/s LV E/e' lateral: 14.4  RIGHT VENTRICLE RV Basal diam:  4.70 cm RV Mid diam:    3.30 cm LEFT ATRIUM              Index       RIGHT ATRIUM           Index LA Vol (A2C):   178.0 ml 76.28 ml/m RA Area:     25.10 cm LA Vol (A4C):   117.0 ml 50.14 ml/m RA Volume:   94.30 ml  40.41 ml/m LA Biplane Vol: 145.0 ml 62.14 ml/m  MITRAL VALVE MV Area (PHT): 6.77 cm MV Decel Time: 112 msec MV E velocity: 100.50 cm/s MV A velocity: 75.00 cm/s MV E/A ratio:  1.34 Dixie Dials MD Electronically signed by Dixie Dials MD Signature Date/Time: 08/09/2020/7:09:45 PM    Final

## 2020-08-14 LAB — BASIC METABOLIC PANEL WITH GFR
Anion gap: 8 (ref 5–15)
BUN: 16 mg/dL (ref 8–23)
CO2: 25 mmol/L (ref 22–32)
Calcium: 9.5 mg/dL (ref 8.9–10.3)
Chloride: 105 mmol/L (ref 98–111)
Creatinine, Ser: 1.03 mg/dL (ref 0.61–1.24)
GFR, Estimated: >60 mL/min
Glucose, Bld: 148 mg/dL — ABNORMAL HIGH (ref 70–99)
Potassium: 3.9 mmol/L (ref 3.5–5.1)
Sodium: 138 mmol/L (ref 135–145)

## 2020-08-14 LAB — GLUCOSE, CAPILLARY
Glucose-Capillary: 108 mg/dL — ABNORMAL HIGH (ref 70–99)
Glucose-Capillary: 182 mg/dL — ABNORMAL HIGH (ref 70–99)
Glucose-Capillary: 131 mg/dL — ABNORMAL HIGH (ref 70–99)
Glucose-Capillary: 175 mg/dL — ABNORMAL HIGH (ref 70–99)

## 2020-08-14 LAB — CBC
HCT: 38.9 % — ABNORMAL LOW (ref 39.0–52.0)
Hemoglobin: 12.9 g/dL — ABNORMAL LOW (ref 13.0–17.0)
MCH: 29.5 pg (ref 26.0–34.0)
MCHC: 33.2 g/dL (ref 30.0–36.0)
MCV: 88.8 fL (ref 80.0–100.0)
Platelets: 230 10*3/uL (ref 150–400)
RBC: 4.38 MIL/uL (ref 4.22–5.81)
RDW: 14.4 % (ref 11.5–15.5)
WBC: 5.9 10*3/uL (ref 4.0–10.5)
nRBC: 0 % (ref 0.0–0.2)

## 2020-08-14 LAB — HEPARIN LEVEL (UNFRACTIONATED): Heparin Unfractionated: 1.02 [IU]/mL — ABNORMAL HIGH (ref 0.30–0.70)

## 2020-08-14 MED ORDER — SPIRONOLACTONE 12.5 MG HALF TABLET
12.5000 mg | ORAL_TABLET | Freq: Two times a day (BID) | ORAL | Status: DC
  Administered 2020-08-14 – 2020-08-15 (×3): 12.5 mg via ORAL
  Filled 2020-08-14 (×4): qty 1

## 2020-08-14 MED ORDER — ENOXAPARIN SODIUM 100 MG/ML ~~LOC~~ SOLN
100.0000 mg | Freq: Two times a day (BID) | SUBCUTANEOUS | Status: DC
  Administered 2020-08-14 – 2020-08-15 (×3): 100 mg via SUBCUTANEOUS
  Filled 2020-08-14 (×3): qty 1

## 2020-08-14 MED ORDER — HEPARIN (PORCINE) 25000 UT/250ML-% IV SOLN
1250.0000 [IU]/h | INTRAVENOUS | Status: DC
  Filled 2020-08-14: qty 250

## 2020-08-14 MED ORDER — INSULIN ASPART PROT & ASPART (70-30 MIX) 100 UNIT/ML ~~LOC~~ SUSP
16.0000 [IU] | Freq: Two times a day (BID) | SUBCUTANEOUS | Status: DC
  Administered 2020-08-14 – 2020-08-15 (×2): 16 [IU] via SUBCUTANEOUS
  Filled 2020-08-14: qty 10

## 2020-08-14 NOTE — Evaluation (Signed)
Physical Therapy Evaluation/ Discharge Patient Details Name: Brian Carpenter MRN: [id number] DOB: 1950-12-19 Today's Date: 08/14/2020   History of Present Illness  70 yo admitted in transfer from [facility] with CAD with plan for CABG. Pt found to be covid + on admission and will have to wait til after 10 day isolation for CABG. PMhx: HTN, HLD, obesity, gastric BPG, DM  Clinical Impression  Pt in recliner on arrival. Pt reports being very independent at home but with history of falls. Pt lives in historic home and unable to install handrail but reports rope anchored by stairs for support. Pt with good stability and endurance for limited activity available in room. Pt educated for walking program, sternal precautions post op and able to demonstrate repeated sit to stand without UE use. Pt moving well without acute need for therapy at this time. Pt verbalized understanding of education and precautions. Will sign off and await orders post op, pt aware and agreeable. Encouraged continued walking and sit to stands acutely.  HR 68-90    Follow Up Recommendations No PT follow up    Equipment Recommendations  None recommended by PT    Recommendations for Other Services       Precautions / Restrictions Precautions Precautions: None      Mobility  Bed Mobility               General bed mobility comments: in chair on arrival and end of session    Transfers Overall transfer level: Independent               General transfer comment: pt performed 10 sit to stands in 45 sec without use of UE  Ambulation/Gait Ambulation/Gait assistance: Independent Gait Distance (Feet): 200 Feet Assistive device: None Gait Pattern/deviations: WFL(Within Functional Limits)   Gait velocity interpretation: >4.37 ft/sec, indicative of normal walking speed General Gait Details: pt able to walk at least 200' making laps in room no SOB or CP with HR 68-90  Stairs            Wheelchair Mobility     Modified Rankin (Stroke Patients Only)       Balance Overall balance assessment: History of Falls                                           Pertinent Vitals/Pain Pain Assessment: No/denies pain    Home Living Family/patient expects to be discharged to:: Private residence Living Arrangements: Spouse/significant other Available Help at Discharge: Family;Available 24 hours/day Type of Home: House Home Access: Stairs to enter   [employer] of Steps: 7 Home Layout: One level Home Equipment: None      Prior Function Level of Independence: Independent         Comments: 6 falls in the last year     Hand Dominance        Extremity/Trunk Assessment   Upper Extremity Assessment Upper Extremity Assessment: Overall WFL for tasks assessed    Lower Extremity Assessment Lower Extremity Assessment: Overall WFL for tasks assessed    Cervical / Trunk Assessment Cervical / Trunk Assessment: Normal  Communication   Communication: No difficulties  Cognition Arousal/Alertness: Awake/alert Behavior During Therapy: WFL for tasks assessed/performed Overall Cognitive Status: Within Functional Limits for tasks assessed  General Comments General comments (skin integrity, edema, etc.): pt reports history of falls typically when getting in a hurry with a project, no obvious balance deficits during session but encouraged to have wife assist on stairs as no railing and unable to install due to historic home    Exercises     Assessment/Plan    PT Assessment Patent does not need any further PT services  PT Problem List         PT Treatment Interventions      PT Goals (Current goals can be found in the Care Plan section)  Acute Rehab PT Goals PT Goal Formulation: All assessment and education complete, DC therapy    Frequency     Barriers to discharge        Co-evaluation                AM-PAC PT "6 Clicks" Mobility  Outcome Measure Help needed turning from your back to your side while in a flat bed without using bedrails?: None Help needed moving from lying on your back to sitting on the side of a flat bed without using bedrails?: None Help needed moving to and from a bed to a chair (including a wheelchair)?: None Help needed standing up from a chair using your arms (e.g., wheelchair or bedside chair)?: None Help needed to walk in hospital room?: None Help needed climbing 3-5 steps with a railing? : None 6 Click Score: 24    End of Session   Activity Tolerance: Patient tolerated treatment well Patient left: in chair (pt with chair across room from bell and denied wanting to have call bell near, cell phone in hand) Nurse Communication: Mobility status PT Visit Diagnosis: Other abnormalities of gait and mobility (R26.89)    Time: 7680-8811 PT Time Calculation (min) (ACUTE ONLY): 16 min   Charges:   PT Evaluation $PT Eval Low Complexity: Cameron, PT Acute Rehabilitation Services Pager: [phone] Office: [place] 08/14/2020, 1:56 PM

## 2020-08-14 NOTE — Progress Notes (Signed)
      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 EF 35-40%.  Found to be COVID positive on admission screening but has exhibited no symptome of active infection thus far.  This afternoon, he is sitting up in his room watching TV. Says he feels well, denies chest pain or shortness of breath.      Plan for CABG on Tuesday 08/21/20 by Dr. Carolee Rota, PA-C [phone]

## 2020-08-14 NOTE — Progress Notes (Signed)
Ref: Brian Fear, DO   Subjective:  VS stable. Cough is decreasing. PT evaluation pending. CVTS f/u pending. Patient needs education on Lovenox shots. He needs to stop Lovenox at least 12 hours prior to surgery next week. Pharmacy to look into coverage and assistance. Discussed care with wife and she understood plan.  Objective:  Vital Signs in the last 24 hours: Temp:  [97.6 F (36.4 C)-98.2 F (36.8 C)] 97.6 F (36.4 C) (03/15 1723) Pulse Rate:  [61-85] 66 (03/15 1723) Cardiac Rhythm: Sinus bradycardia (03/15 0857) Resp:  [13-20] 13 (03/15 1723) BP: (122-164)/(61-84) 147/77 (03/15 1723) SpO2:  [93 %-99 %] 99 % (03/15 1723) Weight:  [109.3 kg] 109.3 kg (03/15 0619)  Physical Exam: BP Readings from Last 1 Encounters:  08/14/20 (!) 147/77     Wt Readings from Last 1 Encounters:  08/14/20 109.3 kg    Weight change: 1.517 kg Body mass index is 31.8 kg/m. HEENT: [place]/AT, Eyes-Brown, Conjunctiva-Pink, Sclera-Non-icteric Neck: No JVD, No bruit, Trachea midline. Lungs:  Clear, Bilateral. Cardiac:  Regular rhythm, normal S1 and S2, no S3. II/VI systolic murmur. Abdomen:  Soft, non-tender. BS present. Extremities:  No edema present. No cyanosis. No clubbing. CNS: AxOx3, Cranial nerves grossly intact, moves all 4 extremities.  Skin: Warm and dry.   Intake/Output from previous day: 03/14 0701 - 03/15 0700 In: 888 [P.O.:730; I.V.:158] Out: 2225 [Urine:2225]    Lab Results: BMET    Component Value Date/Time   NA 138 08/14/2020 0139   NA 139 08/12/2020 0305   NA 137 08/11/2020 0141   K 3.9 08/14/2020 0139   K 3.4 (L) 08/12/2020 0305   K 3.9 08/11/2020 0141   CL 105 08/14/2020 0139   CL 109 08/12/2020 0305   CL 106 08/11/2020 0141   CO2 25 08/14/2020 0139   CO2 22 08/12/2020 0305   CO2 23 08/11/2020 0141   GLUCOSE 148 (H) 08/14/2020 0139   GLUCOSE 183 (H) 08/12/2020 0305   GLUCOSE 205 (H) 08/11/2020 0141   BUN 16 08/14/2020 0139   BUN 11 08/12/2020 0305   BUN 12  08/11/2020 0141   BUN 14 09/29/2019 0000   CREATININE 1.03 08/14/2020 0139   CREATININE 0.97 08/12/2020 0305   CREATININE 0.84 08/11/2020 0141   CALCIUM 9.5 08/14/2020 0139   CALCIUM 9.0 08/12/2020 0305   CALCIUM 9.3 08/11/2020 0141   GFRNONAA >60 08/14/2020 0139   GFRNONAA >60 08/12/2020 0305   GFRNONAA >60 08/11/2020 0141   CBC    Component Value Date/Time   WBC 5.9 08/14/2020 0139   RBC 4.38 08/14/2020 0139   HGB 12.9 (L) 08/14/2020 0139   HCT 38.9 (L) 08/14/2020 0139   PLT 230 08/14/2020 0139   MCV 88.8 08/14/2020 0139   MCH 29.5 08/14/2020 0139   MCHC 33.2 08/14/2020 0139   RDW 14.4 08/14/2020 0139   LYMPHSABS 1.9 08/10/2020 0938   MONOABS 0.7 08/10/2020 0938   EOSABS 0.2 08/10/2020 0938   BASOSABS 0.0 08/10/2020 0938   HEPATIC Function Panel Recent Labs    08/10/20 0124 08/11/20 0141 08/12/20 0305  PROT 6.5 6.4* 6.1*   HEMOGLOBIN A1C No components found for: HGA1C,  MPG CARDIAC ENZYMES No results found for: CKTOTAL, CKMB, CKMBINDEX, TROPONINI BNP No results for input(s): PROBNP in the last 8760 hours. TSH No results for input(s): TSH in the last 8760 hours. CHOLESTEROL Recent Labs    09/29/19 0000 08/09/20 0128  CHOL 206* 259*    Scheduled Meds: . amLODipine  10 mg Oral  Daily  . aspirin EC  81 mg Oral Daily  . atorvastatin  80 mg Oral Daily  . carvedilol  3.125 mg Oral BID WC  . empagliflozin  10 mg Oral Daily  . enoxaparin (LOVENOX) injection  100 mg Subcutaneous Q12H  . insulin aspart  0-15 Units Subcutaneous TID WC  . insulin aspart protamine- aspart  16 Units Subcutaneous BID WC  . isosorbide mononitrate  60 mg Oral Daily  . lisinopril  40 mg Oral Daily  . multivitamin with minerals  1 tablet Oral Daily  . pantoprazole  40 mg Oral Daily  . sertraline  100 mg Oral Daily  . spironolactone  12.5 mg Oral BID  . traZODone  50 mg Oral QHS   Continuous Infusions: PRN Meds:.acetaminophen, hydrALAZINE, nitroGLYCERIN, ondansetron (ZOFRAN)  IV  Assessment/Plan: Acute coronary syndrome Multivessel CAD HTN HLD Type 2 DM Obesity COVID-19 infection  Plan:  PT evaluation Lovenox self injection teaching CVTS f/u and surgery arrangement.   LOS: 6 days   Time spent including chart review, lab review, examination, discussion with patient/Nurse/Doctor/PA/Wife : 40 min   Dixie Dials  MD  08/14/2020, 7:34 PM

## 2020-08-14 NOTE — TOC Benefit Eligibility Note (Signed)
Transition of Care [facility]) Benefit Eligibility Note    Patient Details  Name: Brian Carpenter MRN: [id number] Date of Birth: 12-01-1950   Medication/Dose: Enoxaparin 100mg .  bid for 14 day supply  Covered?: Yes  Tier: 3 Drug  Prescription Coverage [facility]erred [facility]  Spoke with Person/Company/Phone Number:: Paige L. W/[employer] PH# [phone]  Co-Pay: $100  Prior Approval: No  Deductible:  (No Deductible)       Shelda Altes Phone Number: 08/14/2020, 12:21 PM

## 2020-08-14 NOTE — Progress Notes (Signed)
PROGRESS NOTE                                                                             PROGRESS NOTE                                                                                                                                                                                                             Patient Demographics:    Brian Carpenter, is a 70 y.o. male, DOB - 1950/09/29, NTZ:[id number]  Outpatient Primary MD for the patient is Emelda Fear, DO    LOS - 6  Admit date - 08/08/2020    No chief complaint on file.      Brief Narrative    Brian Carpenter is an 70 y.o. male with h/o HTN; HLD; obesity s/p gastric bypass; and DM presenting as a transfer from [facility] due to multivessel CAD.  He was admitted by Dr. Doylene Canard on 3/9.  With plan for CABG, He was subsequently found to have COVID on 3/9.  He reports 1-2 weeks of mild intermittent cough, he was treated with 3 days of IV Remdesivir, plan for CABG surgery pending finishing 10 days isolation for his Covid infection, triage hospitalist consulted for COVID-19 infection, patient is vaccinated, treated with 3 days of remdesivir.    Subjective:    Brian Carpenter today denies any chest pain, reports mild cough, reports dyspnea with activity .    Assessment  & Plan :    Principal Problem:   Acute coronary syndrome (HCC) Active Problems:   Type 2 diabetes mellitus with hyperglycemia, without long-term current use of insulin (HCC)   Essential hypertension, benign   Mixed hyperlipidemia   Ischemic cardiomyopathy   COVID-19 virus infection    ACS -Patient with substernal chest pain that came on acutely -CXR unremarkable.  -Cath revealed multivessel disease -Dr. Doylene Canard from cardiology admitted the patient and is managing his CAD -On Heparin drip -Was seen by cardiothoracic surgery team,  he needs to  finish his 10 days isolation before proceeding with  CABG surgery.  Incidental  COVID -Patient with 2 immunizations - recently received his 2nd dose -Treated with 3 days of remdesivir.  Ischemic cardiomyopathy -Echo with EF 35-40% and grade 1 diastolic dysfunction -Appears to be compensated at this time -Managment per cardiology, propranolol changed to Coreg, per cardiology likely will change lisinopril to [facility] on discharge.  HTN -Continue beta-blocker -Resume ACE vs. Start Entresto when appropriate by cardiology -Blood pressure remains elevated at increasing his Imdur, will go ahead and increase his Norvasc from 5 to 10 mg oral daily .  HLD -Started on Lipitor 80 mg daily -Lipids were checked and indicate very poor control with LDL 168  DM -A1c is 12, indicating very poor control -He takes 70/30 at home and this has been continued with SSI -Overall CBG has been controlled, but he had one CBG of 65 yesterday evening, so I will decrease his insulin to 16 units twice daily -Continue with insulin sliding scale. -started on Jardiance.  Obesity -Body mass index is 31.88 kg/m..  -s/p gastric bypass -Weight loss should be encouraged -Outpatient PCP/bariatric medicine f/u encouraged  Hypokalemia - repleted.    [facility] hospitalist will sign off currently, please reconsult [place] if there is any acute issue evolves.  SpO2: 95 %  Recent Labs  Lab 08/08/20 1927 08/09/20 0128 08/09/20 0128 08/10/20 0124 08/10/20 4098 08/11/20 0141 08/12/20 0305 08/13/20 0219 08/14/20 0139  WBC  --  5.8   < > 6.5 6.3 7.5 5.2 6.9 5.9  PLT  --  198   < > 213 216 197 200 188 230  CRP  --   --   --   --  0.9  --   --   --   --   DDIMER  --   --   --   --  0.74*  --   --   --   --   PROCALCITON  --   --   --   --  <0.10  --   --   --   --   AST  --   --   --  18  --  20 23  --   --   ALT  --   --   --  13  --  14 16  --   --   ALKPHOS  --   --   --  64  --  64 60  --   --   BILITOT  --   --   --  0.7  --  0.8 0.5  --   --   ALBUMIN  --   --   --  3.2*  --  3.1* 2.9*   --   --   INR  --  1.0  --   --   --   --   --   --   --   SARSCOV2NAA POSITIVE*  --   --   --   --   --   --   --   --    < > = values in this interval not displayed.       ABG  No results found for: PHART, PCO2ART, PO2ART, HCO3, TCO2, ACIDBASEDEF, O2SAT    Condition - Extremely Guarded  Family Communication  :  Per primary  Code Status :  Full  Procedures  :  none  Disposition Plan  :    Status is: Inpatient  Remains inpatient appropriate because:IV treatments appropriate due to intensity  of illness or inability to take PO   Dispo: The patient is from: Home              Anticipated d/c is to: Home              Patient currently is not medically stable to d/c.   Difficult to place patient No      DVT Prophylaxis  :  On heparin GTT  Lab Results  Component Value Date   PLT 230 08/14/2020    Diet :  Diet Order            Diet heart healthy/carb modified Room service appropriate? Yes; Fluid consistency: Thin  Diet effective now                  Inpatient Medications  Scheduled Meds: . amLODipine  10 mg Oral Daily  . aspirin EC  81 mg Oral Daily  . atorvastatin  80 mg Oral Daily  . carvedilol  3.125 mg Oral BID WC  . empagliflozin  10 mg Oral Daily  . enoxaparin (LOVENOX) injection  100 mg Subcutaneous Q12H  . insulin aspart  0-15 Units Subcutaneous TID WC  . insulin aspart protamine- aspart  16 Units Subcutaneous BID WC  . isosorbide mononitrate  60 mg Oral Daily  . lisinopril  40 mg Oral Daily  . multivitamin with minerals  1 tablet Oral Daily  . pantoprazole  40 mg Oral Daily  . sertraline  100 mg Oral Daily  . spironolactone  12.5 mg Oral BID  . traZODone  50 mg Oral QHS   Continuous Infusions:  PRN Meds:.acetaminophen, hydrALAZINE, nitroGLYCERIN, ondansetron (ZOFRAN) IV  Antibiotics  :    Anti-infectives (From admission, onward)   Start     Dose/Rate Route Frequency Ordered Stop   08/10/20 1058  remdesivir 100 mg in sodium chloride  0.9 % 100 mL IVPB       "Followed by" Linked Group Details   100 mg 200 mL/hr over 30 Minutes Intravenous Daily 08/10/20 1058 08/11/20 0946   08/10/20 1000  remdesivir 100 mg in sodium chloride 0.9 % 100 mL IVPB  Status:  Discontinued       "Followed by" Linked Group Details   100 mg 200 mL/hr over 30 Minutes Intravenous Daily 08/09/20 0826 08/10/20 1058   08/09/20 1000  remdesivir 200 mg in sodium chloride 0.9% 250 mL IVPB       "Followed by" Linked Group Details   200 mg 580 mL/hr over 30 Minutes Intravenous Once 08/09/20 0826 08/09/20 1340       Brian Carpenter M.D on 08/14/2020 at 11:45 AM  To page go to [email or web address]  [facility] -  Office  [phone]      Objective:   Vitals:   08/13/20 2336 08/14/20 0601 08/14/20 0619 08/14/20 0856  BP: 122/61 130/78  (!) 150/79  Pulse: 63 62  61  Resp: 20 15  20   Temp: 97.7 F (36.5 C) 98.2 F (36.8 C)  97.7 F (36.5 C)  TempSrc: Oral Oral  Oral  SpO2: 99% 94%  95%  Weight:   109.3 kg   Height:        Wt Readings from Last 3 Encounters:  08/14/20 109.3 kg  01/04/20 108.7 kg     Intake/Output Summary (Last 24 hours) at 08/14/2020 1145 Last data filed at 08/14/2020 0651 Gross per 24 hour  Intake 648 ml  Output 1950 ml  Net -1302 ml  Physical Exam  Awake Alert, Oriented X 3, No new F.N deficits, Normal affect Symmetrical Chest wall movement, Good air movement bilaterally, CTAB RRR,No Gallops,Rubs or new Murmurs, No Parasternal Heave +ve B.Sounds, Abd Soft, No tenderness, No rebound - guarding or rigidity. No Cyanosis, Clubbing or edema, No new Rash or bruise       Data Review:    CBC Recent Labs  Lab 08/10/20 0938 08/11/20 0141 08/12/20 0305 08/13/20 0219 08/14/20 0139  WBC 6.3 7.5 5.2 6.9 5.9  HGB 14.0 13.4 12.3* 11.9* 12.9*  HCT 42.1 39.9 36.7* 35.5* 38.9*  PLT 216 197 200 188 230  MCV 86.8 87.1 87.2 88.1 88.8  MCH 28.9 29.3 29.2 29.5 29.5  MCHC 33.3 33.6 33.5 33.5 33.2  RDW 13.7  14.0 14.3 14.4 14.4  LYMPHSABS 1.9  --   --   --   --   MONOABS 0.7  --   --   --   --   EOSABS 0.2  --   --   --   --   BASOSABS 0.0  --   --   --   --     Recent Labs  Lab 08/09/20 0128 08/10/20 0124 08/10/20 0938 08/11/20 0141 08/12/20 0305 08/14/20 0139  NA 135 138  --  137 139 138  K 4.4 4.0  --  3.9 3.4* 3.9  CL 102 103  --  106 109 105  CO2 27 28  --  23 22 25   GLUCOSE 360* 138*  --  205* 183* 148*  BUN 13 11  --  12 11 16   CREATININE 1.00 1.02  --  0.84 0.97 1.03  CALCIUM 9.2 9.4  --  9.3 9.0 9.5  AST  --  18  --  20 23  --   ALT  --  13  --  14 16  --   ALKPHOS  --  64  --  64 60  --   BILITOT  --  0.7  --  0.8 0.5  --   ALBUMIN  --  3.2*  --  3.1* 2.9*  --   MG  --  1.9  --   --   --   --   CRP  --   --  0.9  --   --   --   DDIMER  --   --  0.74*  --   --   --   PROCALCITON  --   --  <0.10  --   --   --   INR 1.0  --   --   --   --   --   HGBA1C 12.0*  --   --   --   --   --     ------------------------------------------------------------------------------------------------------------------ No results for input(s): CHOL, HDL, LDLCALC, TRIG, CHOLHDL, LDLDIRECT in the last 72 hours.  Lab Results  Component Value Date   HGBA1C 12.0 (H) 08/09/2020   ------------------------------------------------------------------------------------------------------------------ No results for input(s): TSH, T4TOTAL, T3FREE, THYROIDAB in the last 72 hours.  Invalid input(s): FREET3  Cardiac Enzymes No results for input(s): CKMB, TROPONINI, MYOGLOBIN in the last 168 hours.  Invalid input(s): CK ------------------------------------------------------------------------------------------------------------------ No results found for: BNP  Micro Results Recent Results (from the past 240 hour(s))  SARS CORONAVIRUS 2 (TAT 6-24 HRS) Nasopharyngeal Nasopharyngeal Swab     Status: Abnormal   Collection Time: 08/08/20  7:27 PM   Specimen: Nasopharyngeal Swab  Result Value Ref  Range Status   SARS Coronavirus  2 POSITIVE (A) NEGATIVE Final    Comment: (NOTE) SARS-CoV-2 target nucleic acids are DETECTED.  The SARS-CoV-2 RNA is generally detectable in upper and lower respiratory specimens during the acute phase of infection. Positive results are indicative of the presence of SARS-CoV-2 RNA. Clinical correlation with patient history and other diagnostic information is  necessary to determine patient infection status. Positive results do not rule out bacterial infection or co-infection with other viruses.  The expected result is Negative.  Fact Sheet for Patients: SugarRoll.be  Fact Sheet for Healthcare Providers: https://www.woods-mathews.com/  This test is not yet approved or cleared by the [place] FDA and  has been authorized for detection and/or diagnosis of SARS-CoV-2 by FDA under an Emergency Use Authorization (EUA). This EUA will remain  in effect (meaning this test can be used) for the duration of the COVID-19 declaration under Section 564(b)(1) of the Act, 21 U. S.C. section 360bbb-3(b)(1), unless the authorization is terminated or revoked sooner.   Performed at [facility], [place] [address] [place],  [address]     Radiology Reports DG Chest 2 View  Result Date: 08/08/2020 CLINICAL DATA:  Chest pain EXAM: CHEST - 2 VIEW COMPARISON:  None. FINDINGS: Cardiac shadow is enlarged. The lungs are clear. No bony abnormality is noted. IMPRESSION: Cardiomegaly without acute abnormality. Electronically Signed   By: Inez Catalina M.D.   On: 08/08/2020 21:40   CT CHEST WO CONTRAST  Result Date: 08/09/2020 CLINICAL DATA:  Thoracic aortic disease, preoperative planning CABG EXAM: CT CHEST WITHOUT CONTRAST TECHNIQUE: Multidetector CT imaging of the chest was performed following the standard protocol without IV contrast. COMPARISON:  None. FINDINGS: Cardiovascular: Scattered aortic atherosclerosis.  Incidental note of bovine type 2 vessel branching pattern of the aortic arch. Normal heart size. Three-vessel coronary artery calcifications and/or stents. No pericardial effusion. The left brachiocephalic vein lies 1.8 cm posterior to the manubrium. The tubular ascending aorta lies 2.1 cm posterior to the sternal body. The right ventricle lies 0.8 cm posterior to the sternal body. Mediastinum/Nodes: No enlarged mediastinal, hilar, or axillary lymph nodes. Thyroid gland, trachea, and esophagus demonstrate no significant findings. Lungs/Pleura: Minimal scarring of the lung bases. No pleural effusion or pneumothorax. Upper Abdomen: No acute abnormality. 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 Nonobstructive superior pole calculus of the right kidney. Musculoskeletal: No chest wall mass or suspicious bone lesions identified. IMPRESSION: 1. The left brachiocephalic vein lies 1.8 cm posterior to the manubrium. The tubular ascending aorta lies 2.1 cm posterior to the sternal body. The right ventricle lies 0.8 cm posterior to the sternal body. 2. Scattered aortic atherosclerosis. 3. Coronary artery disease. 4. Nonobstructive right nephrolithiasis. Aortic Atherosclerosis (ICD10-I70.0). Electronically Signed   By: Eddie Candle M.D.   On: 08/09/2020 13:36   ECHOCARDIOGRAM COMPLETE  Result Date: 08/09/2020    ECHOCARDIOGRAM REPORT   Patient Name:   Brian Carpenter Date of Exam: 08/09/2020 Medical Rec #:  [id number]        Height:       73.0 in Accession #:    [id number]       Weight:       242.1 lb Date of Birth:  01-04-1951        BSA:          2.333 m Patient Age:    42 years         BP:           159/94 mmHg  Patient Gender: M                HR:           63 bpm. Exam Location:  Inpatient Procedure: 2D Echo, Cardiac Doppler and Color Doppler Indications:     CAD  History:         Patient has no prior history of Echocardiogram examinations.                  CAD;  Risk Factors:Diabetes. Covid 19 positive.  Sonographer:     Merrie Roof RDCS Referring Phys:  [place] Diagnosing Phys: Dixie Dials MD IMPRESSIONS  1. Left ventricular ejection fraction, by estimation, is 35 to 40%. The left ventricle has moderately decreased function. The left ventricle demonstrates global hypokinesis. The left ventricular internal cavity size was mildly dilated. There is mild concentric left ventricular hypertrophy. Left ventricular diastolic parameters are consistent with Grade I diastolic dysfunction (impaired relaxation).  2. Right ventricular systolic function is normal. The right ventricular size is normal.  3. Left atrial size was severely dilated.  4. Right atrial size was moderately dilated.  5. The mitral valve is degenerative. Mild mitral valve regurgitation. No evidence of mitral stenosis.  6. The aortic valve is tricuspid. Aortic valve regurgitation is not visualized. Mild aortic valve sclerosis is present, with no evidence of aortic valve stenosis.  7. The inferior vena cava is normal in size with greater than 50% respiratory variability, suggesting right atrial pressure of 3 mmHg. FINDINGS  Left Ventricle: Left ventricular ejection fraction, by estimation, is 35 to 40%. The left ventricle has moderately decreased function. The left ventricle demonstrates global hypokinesis. The left ventricular internal cavity size was mildly dilated. There is mild concentric left ventricular hypertrophy. Left ventricular diastolic parameters are consistent with Grade I diastolic dysfunction (impaired relaxation). Right Ventricle: The right ventricular size is normal. No increase in right ventricular wall thickness. Right ventricular systolic function is normal. Left Atrium: Left atrial size was severely dilated. Right Atrium: Right atrial size was moderately dilated. Pericardium: There is no evidence of pericardial effusion. Mitral Valve: The mitral valve is degenerative in appearance.  There is mild thickening of the mitral valve leaflet(s). There is mild calcification of the mitral valve leaflet(s). Mild mitral annular calcification. Mild mitral valve regurgitation. No evidence of mitral valve stenosis. Tricuspid Valve: The tricuspid valve is normal in structure. Tricuspid valve regurgitation is mild. Aortic Valve: The aortic valve is tricuspid. Aortic valve regurgitation is not visualized. Mild aortic valve sclerosis is present, with no evidence of aortic valve stenosis. Pulmonic Valve: The pulmonic valve was not well visualized. Pulmonic valve regurgitation is not visualized. Aorta: The aortic root is normal in size and structure. There is minimal (Grade I) atheroma plaque involving the ascending aorta and aortic root. Venous: The inferior vena cava is normal in size with greater than 50% respiratory variability, suggesting right atrial pressure of 3 mmHg. IAS/Shunts: The interatrial septum was not well visualized.   Diastology LV e' medial:    4.91 cm/s LV E/e' medial:  20.5 LV e' lateral:   6.96 cm/s LV E/e' lateral: 14.4  RIGHT VENTRICLE RV Basal diam:  4.70 cm RV Mid diam:    3.30 cm LEFT ATRIUM              Index       RIGHT ATRIUM           Index LA Vol (A2C):   178.0 ml 76.28  ml/m RA Area:     25.10 cm LA Vol (A4C):   117.0 ml 50.14 ml/m RA Volume:   94.30 ml  40.41 ml/m LA Biplane Vol: 145.0 ml 62.14 ml/m  MITRAL VALVE MV Area (PHT): 6.77 cm MV Decel Time: 112 msec MV E velocity: 100.50 cm/s MV A velocity: 75.00 cm/s MV E/A ratio:  1.34 Dixie Dials MD Electronically signed by Dixie Dials MD Signature Date/Time: 08/09/2020/7:09:45 PM    Final

## 2020-08-14 NOTE — Progress Notes (Signed)
[facility] for heparin Indication: chest pain/ACS  No Known Allergies  Patient Measurements: Height: 6\' 1"  (185.4 cm) Weight: 107.8 kg (237 lb 10.5 oz) IBW/kg (Calculated) : 79.9 Heparin Dosing Weight: 110kg  Vital Signs: Temp: 97.7 F (36.5 C) (03/14 2336) Temp Source: Oral (03/14 2336) BP: 122/61 (03/14 2336) Pulse Rate: 63 (03/14 2336)  Labs: Recent Labs    08/12/20 0305 08/13/20 0219 08/14/20 0139  HGB 12.3* 11.9* 12.9*  HCT 36.7* 35.5* 38.9*  PLT 200 188 230  HEPARINUNFRC 0.35 0.80* 1.02*  CREATININE 0.97  --  1.03    Estimated Creatinine Clearance: 87.2 mL/min (by C-G formula based on SCr of 1.03 mg/dL).   Medical History: Past Medical History:  Diagnosis Date  . Cancer ([place])   . Diabetes mellitus, type II ([place])   . Hyperlipidemia   . Hypertension     Medications:  Medications Prior to Admission  Medication Sig Dispense Refill Last Dose  . aspirin EC 81 MG tablet Take 81 mg by mouth daily. Swallow whole.   08/07/2020 at Unknown time  . b complex vitamins capsule Take 1 capsule by mouth daily.   08/07/2020 at Unknown time  . cetirizine (ZYRTEC) 10 MG tablet Take 1 tablet by mouth daily.   08/07/2020 at Unknown time  . Cholecalciferol (VITAMIN D3) 50 MCG (2000 UT) TABS Take 1 tablet by mouth daily.   08/07/2020 at Unknown time  . furosemide (LASIX) 40 MG tablet Take 40 mg by mouth daily.   08/07/2020 at Unknown time  . glucose blood (RELION GLUCOSE TEST STRIPS) test strip Use as instructed 100 each 2 unknown at unknown  . insulin NPH-regular Human (70-30) 100 UNIT/ML injection Inject 20 Units into the skin 2 (two) times daily. (Patient taking differently: Inject 20 Units into the skin 2 (two) times daily. Per sliding scale) 30 mL 1 08/07/2020 at Unknown time  . isosorbide dinitrate (ISORDIL) 10 MG tablet Take 10 mg by mouth 2 (two) times daily.   08/07/2020 at Unknown time  . lisinopril (ZESTRIL) 40 MG tablet Take 40 mg by mouth daily.    08/08/2020 at Unknown time  . Multiple Vitamins-Minerals (MENS MULTIVITAMIN PO) Take 1 tablet by mouth daily.   08/07/2020 at Unknown time  . nitroGLYCERIN (NITROSTAT) 0.4 MG SL tablet Place 0.4 mg under the tongue every 5 (five) minutes as needed for chest pain.   unknown at unknown  . propranolol (INDERAL) 40 MG tablet Take 1 tablet by mouth daily.   08/08/2020 at 1000  . sertraline (ZOLOFT) 100 MG tablet Take 1 tablet by mouth daily.   08/07/2020 at Unknown time  . traZODone (DESYREL) 50 MG tablet Take 1 tablet by mouth at bedtime.   08/07/2020 at Unknown time   Scheduled:  . amLODipine  10 mg Oral Daily  . aspirin EC  81 mg Oral Daily  . atorvastatin  80 mg Oral Daily  . carvedilol  3.125 mg Oral BID WC  . empagliflozin  10 mg Oral Daily  . insulin aspart  0-15 Units Subcutaneous TID WC  . insulin aspart protamine- aspart  20 Units Subcutaneous BID WC  . isosorbide mononitrate  60 mg Oral Daily  . lisinopril  40 mg Oral Daily  . multivitamin with minerals  1 tablet Oral Daily  . pantoprazole  40 mg Oral Daily  . sertraline  100 mg Oral Daily  . traZODone  50 mg Oral QHS   Infusions:  . heparin  Assessment: Pt was [facility] for CP. He is s/p cath>>3VD. He was started on IV heparin post cath. He was transferred for CABG. He is not on anticoagulation prior to admission. Pharmacy consulted for heparin.  Heparin level above goal this morning at 0.8, CBC remains stable, CABG plans pending.  3/15 AM update:  Heparin level elevated  No issues per RN  Goal of Therapy:  Heparin level 0.3-0.7 units/ml Monitor platelets by anticoagulation protocol: Yes   Plan:  Hold heparin x 1 hr Re-start heparin at 1250 units/hr at 0500 1300 heparin level  Narda Bonds, PharmD, [place] Pharmacist Phone: [phone]

## 2020-08-14 NOTE — Progress Notes (Signed)
[place] for heparin Indication: chest pain/ACS  No Known Allergies  Patient Measurements: Height: 6\' 1"  (185.4 cm) Weight: 109.3 kg (241 lb) IBW/kg (Calculated) : 79.9 Heparin Dosing Weight: 110kg  Vital Signs: Temp: 97.7 F (36.5 C) (03/15 0856) Temp Source: Oral (03/15 0856) BP: 150/79 (03/15 0856) Pulse Rate: 61 (03/15 0856)  Labs: Recent Labs    08/12/20 0305 08/13/20 0219 08/14/20 0139  HGB 12.3* 11.9* 12.9*  HCT 36.7* 35.5* 38.9*  PLT 200 188 230  HEPARINUNFRC 0.35 0.80* 1.02*  CREATININE 0.97  --  1.03    Estimated Creatinine Clearance: 87.8 mL/min (by C-G formula based on SCr of 1.03 mg/dL).   Medical History: Past Medical History:  Diagnosis Date  . Cancer ([place])   . Diabetes mellitus, type II ([place])   . Hyperlipidemia   . Hypertension     Medications:  Medications Prior to Admission  Medication Sig Dispense Refill Last Dose  . aspirin EC 81 MG tablet Take 81 mg by mouth daily. Swallow whole.   08/07/2020 at Unknown time  . b complex vitamins capsule Take 1 capsule by mouth daily.   08/07/2020 at Unknown time  . cetirizine (ZYRTEC) 10 MG tablet Take 1 tablet by mouth daily.   08/07/2020 at Unknown time  . Cholecalciferol (VITAMIN D3) 50 MCG (2000 UT) TABS Take 1 tablet by mouth daily.   08/07/2020 at Unknown time  . furosemide (LASIX) 40 MG tablet Take 40 mg by mouth daily.   08/07/2020 at Unknown time  . glucose blood (RELION GLUCOSE TEST STRIPS) test strip Use as instructed 100 each 2 unknown at unknown  . insulin NPH-regular Human (70-30) 100 UNIT/ML injection Inject 20 Units into the skin 2 (two) times daily. (Patient taking differently: Inject 20 Units into the skin 2 (two) times daily. Per sliding scale) 30 mL 1 08/07/2020 at Unknown time  . isosorbide dinitrate (ISORDIL) 10 MG tablet Take 10 mg by mouth 2 (two) times daily.   08/07/2020 at Unknown time  . lisinopril (ZESTRIL) 40 MG tablet Take 40 mg by mouth daily.    08/08/2020 at Unknown time  . Multiple Vitamins-Minerals (MENS MULTIVITAMIN PO) Take 1 tablet by mouth daily.   08/07/2020 at Unknown time  . nitroGLYCERIN (NITROSTAT) 0.4 MG SL tablet Place 0.4 mg under the tongue every 5 (five) minutes as needed for chest pain.   unknown at unknown  . propranolol (INDERAL) 40 MG tablet Take 1 tablet by mouth daily.   08/08/2020 at 1000  . sertraline (ZOLOFT) 100 MG tablet Take 1 tablet by mouth daily.   08/07/2020 at Unknown time  . traZODone (DESYREL) 50 MG tablet Take 1 tablet by mouth at bedtime.   08/07/2020 at Unknown time   Scheduled:  . amLODipine  10 mg Oral Daily  . aspirin EC  81 mg Oral Daily  . atorvastatin  80 mg Oral Daily  . carvedilol  3.125 mg Oral BID WC  . empagliflozin  10 mg Oral Daily  . insulin aspart  0-15 Units Subcutaneous TID WC  . insulin aspart protamine- aspart  16 Units Subcutaneous BID WC  . isosorbide mononitrate  60 mg Oral Daily  . lisinopril  40 mg Oral Daily  . multivitamin with minerals  1 tablet Oral Daily  . pantoprazole  40 mg Oral Daily  . sertraline  100 mg Oral Daily  . traZODone  50 mg Oral QHS   Infusions:  . heparin 1,250 Units/hr (08/14/20 0651)  Assessment: Pt was transferred from [facility] for CP. He is s/p cath>>3VD. He was started on IV heparin post cath. He was transferred for CABG. He is not on anticoagulation prior to admission. Pharmacy consulted for heparin.  Pharmacy asked to transition to enoxaparin in case pt is discharged home while awaiting CABG.  Goal of Therapy:  Heparin level 0.3-0.7 units/ml Monitor platelets by anticoagulation protocol: Yes   Plan:  Stop heparin Begin enoxaparin 1mg /kg SQ BID (would just use 100mg  syringes)   Arrie Senate, PharmD, BCPS, [facility] Clinical Pharmacist [phone] Please check AMION for all [facility] Pharmacy numbers 08/14/2020

## 2020-08-15 ENCOUNTER — Other Ambulatory Visit: Payer: Self-pay | Admitting: Cardiovascular Disease

## 2020-08-15 ENCOUNTER — Other Ambulatory Visit: Payer: Self-pay | Admitting: *Deleted

## 2020-08-15 ENCOUNTER — Inpatient Hospital Stay (HOSPITAL_COMMUNITY): Payer: Medicare PPO

## 2020-08-15 DIAGNOSIS — Z01810 Encounter for preprocedural cardiovascular examination: Secondary | ICD-10-CM

## 2020-08-15 DIAGNOSIS — I2510 Atherosclerotic heart disease of native coronary artery without angina pectoris: Secondary | ICD-10-CM

## 2020-08-15 LAB — CBC
HCT: 35.2 % — ABNORMAL LOW (ref 39.0–52.0)
Hemoglobin: 12.3 g/dL — ABNORMAL LOW (ref 13.0–17.0)
MCH: 30.5 pg (ref 26.0–34.0)
MCHC: 34.9 g/dL (ref 30.0–36.0)
MCV: 87.3 fL (ref 80.0–100.0)
Platelets: 195 10*3/uL (ref 150–400)
RBC: 4.03 MIL/uL — ABNORMAL LOW (ref 4.22–5.81)
RDW: 14.5 % (ref 11.5–15.5)
WBC: 5.4 10*3/uL (ref 4.0–10.5)
nRBC: 0 % (ref 0.0–0.2)

## 2020-08-15 LAB — GLUCOSE, CAPILLARY
Glucose-Capillary: 153 mg/dL — ABNORMAL HIGH (ref 70–99)
Glucose-Capillary: 159 mg/dL — ABNORMAL HIGH (ref 70–99)

## 2020-08-15 MED ORDER — SPIRONOLACTONE 25 MG PO TABS: 12.5000 mg | ORAL_TABLET | Freq: Two times a day (BID) | ORAL | 1 refills | Status: DC

## 2020-08-15 MED ORDER — CARVEDILOL 3.125 MG PO TABS: 3.1250 mg | ORAL_TABLET | Freq: Two times a day (BID) | ORAL | 1 refills | Status: DC

## 2020-08-15 MED ORDER — AMLODIPINE BESYLATE 10 MG PO TABS: 10.0000 mg | ORAL_TABLET | Freq: Every day | ORAL | 0 refills | Status: DC

## 2020-08-15 MED ORDER — EMPAGLIFLOZIN 10 MG PO TABS: 10.0000 mg | ORAL_TABLET | Freq: Every day | ORAL | 1 refills | Status: DC

## 2020-08-15 MED ORDER — ACETAMINOPHEN 325 MG PO TABS: 650.0000 mg | ORAL_TABLET | ORAL | Status: AC | PRN

## 2020-08-15 MED ORDER — SPIRONOLACTONE 25 MG PO TABS: 12.5000 mg | ORAL_TABLET | Freq: Two times a day (BID) | ORAL | 0 refills | Status: DC

## 2020-08-15 MED ORDER — ENOXAPARIN SODIUM 100 MG/ML ~~LOC~~ SOLN: 100.0000 mg | Freq: Two times a day (BID) | SUBCUTANEOUS | 0 refills | Status: DC

## 2020-08-15 MED ORDER — EMPAGLIFLOZIN 10 MG PO TABS: 10.0000 mg | ORAL_TABLET | Freq: Every day | ORAL | 0 refills | Status: AC

## 2020-08-15 MED ORDER — AMLODIPINE BESYLATE 10 MG PO TABS: 10.0000 mg | ORAL_TABLET | Freq: Every day | ORAL | 1 refills | Status: DC

## 2020-08-15 MED ORDER — CARVEDILOL 3.125 MG PO TABS: 3.1250 mg | ORAL_TABLET | Freq: Two times a day (BID) | ORAL | 0 refills | Status: AC

## 2020-08-15 MED ORDER — ATORVASTATIN CALCIUM 80 MG PO TABS: 80.0000 mg | ORAL_TABLET | Freq: Every day | ORAL | 1 refills | Status: DC

## 2020-08-15 MED ORDER — ATORVASTATIN CALCIUM 80 MG PO TABS: 80.0000 mg | ORAL_TABLET | Freq: Every day | ORAL | 0 refills | Status: AC

## 2020-08-15 NOTE — TOC Benefit Eligibility Note (Signed)
Patient Advocate Encounter   Received notification that prior authorization for Enoxaparin 100 mg/ml twice daily is required.   PA submitted on 08/15/2020 Key [id number] Status is pending       Lyndel Safe, CPhT Pharmacy Patient Advocate Specialist [facility] Antimicrobial Stewardship Team Direct Number: [phone]  Fax: [phone]

## 2020-08-15 NOTE — Discharge Summary (Signed)
Physician Discharge Summary  Patient ID: Brian Carpenter MRN: [id number] DOB/AGE: [birth year] 70 y.o.  Admit date: 08/08/2020 Discharge date: 08/15/2020  Admission Diagnoses: Acute coronary syndrome Multivessel, native vessel CAD HTN HLD Type 2 DM Obesity  Discharge Diagnoses:  Principal Problem:   Acute coronary syndrome ([place]) Active Problems:   Type 2 diabetes mellitus with hyperglycemia, without long-term current use of insulin (HCC)   Essential hypertension, benign   Mixed hyperlipidemia   Ischemic cardiomyopathy   COVID-19 virus infection   Obesity  Discharged Condition: fair  Hospital Course: 70 years old white male with PMH of HTN, HLD, Type 2 DM and obesity underwent cardiac catheterization for chest pain and abnormal NM myocardial perfusion stress test. He had multivessel CAD and was transferred form [place] to [place] for possible CABG. He unfortunately had COVID-19 virus infection on screening. He had minimal symptoms. He was treated with IV Remdesivir for 3 days. He remained on IV heparin for about 1 week and was switched to SQ Lovenox. Patient understood risk of bleeding from Lovenox. He is Jehovah's witness and agrees to discontinue Lovenox 1 day before surgery rather than not taking the SQ injections. His CABG is scheduled for next week. He had some medication changes mostly addition of Atorvastatin, Spironolactone and Jardiance. He was discharged home in stable condition and was free of chest pain with activity. He will see primary care in 3 weeks and me or local cardiologist in 4 weeks and CVTS surgery in less than 1 week as arranged for his CABG.  Consults: cardiology and CVTS  Significant Diagnostic Studies: labs: Normal CBC, BMET except sugar levels. Positive SARS Corona virus 2. D-dimer mildly elevated normal C-reactive protein. Hgb A1C of 12.0. LDL cholesterol of 162 mg, HDL of 36 and total of 259 mg. Triglyceride elevated at 304 mg. suggesting mixed  hyperlipidemia.  EKG: SR, inferior infarct.  [facility]. [facility]: Multivessel diffuse CAD.   Treatments: cardiac meds: lisinopril (Zestril), carvedilol, amlodipine, furosemide and spironolactone.  Discharge Exam: Blood pressure (!) 145/62, pulse 85, temperature 97.8 F (36.6 C), temperature source Oral, resp. rate 20, height 6\' 1"  (1.854 m), weight 110.1 kg, SpO2 98 %. General appearance: alert, cooperative and appears stated age. Head: Normocephalic, atraumatic. Eyes: Brown eyes, pink conjunctiva, corneas clear. PERRL, EOM's intact.  Neck: No adenopathy, no carotid bruit, no JVD, supple, symmetrical, trachea midline and thyroid not enlarged. Resp: Clear to auscultation bilaterally. Cardio: Regular rate and rhythm, S1, S2 normal, II/VI systolic murmur, no click, rub or gallop. GI: Soft, non-tender; bowel sounds normal; no organomegaly. Extremities: No edema, cyanosis or clubbing. Skin: Warm and dry.  Neurologic: Alert and oriented X 3, normal strength and tone. Normal coordination and gait.  Disposition: Discharge disposition: 01-Home or Self Care        Allergies as of 08/15/2020   No Known Allergies     Medication List    STOP taking these medications   propranolol 40 MG tablet Commonly known as: INDERAL     TAKE these medications   acetaminophen 325 MG tablet Commonly known as: TYLENOL Take 2 tablets (650 mg total) by mouth every 4 (four) hours as needed for headache or mild pain.   amLODipine 10 MG tablet Commonly known as: NORVASC Take 1 tablet (10 mg total) by mouth daily.   aspirin EC 81 MG tablet Take 81 mg by mouth daily. Swallow whole.   atorvastatin 80 MG tablet Commonly known as: LIPITOR Take 1 tablet (80 mg total) by mouth daily.  b complex vitamins capsule Take 1 capsule by mouth daily.   carvedilol 3.125 MG tablet Commonly known as: COREG Take 1 tablet (3.125 mg total) by mouth 2 (two) times daily with a meal.   cetirizine 10 MG  tablet Commonly known as: ZYRTEC Take 1 tablet by mouth daily.   empagliflozin 10 MG Tabs tablet Commonly known as: JARDIANCE Take 1 tablet (10 mg total) by mouth daily.   enoxaparin 100 MG/ML injection Commonly known as: LOVENOX Inject 1 mL (100 mg total) into the skin every 12 (twelve) hours.   furosemide 40 MG tablet Commonly known as: LASIX Take 40 mg by mouth daily.   insulin NPH-regular Human (70-30) 100 UNIT/ML injection Inject 20 Units into the skin 2 (two) times daily. What changed: additional instructions   isosorbide dinitrate 10 MG tablet Commonly known as: ISORDIL Take 10 mg by mouth 2 (two) times daily.   lisinopril 40 MG tablet Commonly known as: ZESTRIL Take 40 mg by mouth daily.   MENS MULTIVITAMIN PO Take 1 tablet by mouth daily.   nitroGLYCERIN 0.4 MG SL tablet Commonly known as: NITROSTAT Place 0.4 mg under the tongue every 5 (five) minutes as needed for chest pain.   RELION GLUCOSE TEST STRIPS test strip Generic drug: glucose blood Use as instructed   sertraline 100 MG tablet Commonly known as: ZOLOFT Take 1 tablet by mouth daily.   spironolactone 25 MG tablet Commonly known as: ALDACTONE Take 0.5 tablets (12.5 mg total) by mouth 2 (two) times daily.   traZODone 50 MG tablet Commonly known as: DESYREL Take 1 tablet by mouth at bedtime.   Vitamin D3 50 MCG (2000 UT) Tabs Take 1 tablet by mouth daily.       Follow-up Information    Emelda Fear, DO. Schedule an appointment as soon as possible for a visit in 3 week(s).   Specialty: Family Medicine Why: Earlier if needed Contact information: [place] [address] [phone]        Dixie Dials, MD Follow up in 4 week(s).   Specialty: Cardiology Why: May see local cardiologist if have one Contact information: [place] [address] [phone]               Time spent: Review of old chart, current chart, lab, x-ray, cardiac tests  and discussion with patient/PA/Pharmacist/Nurse/Family over 60 minutes.  Signed: Birdie Riddle 08/15/2020, 9:46 AM

## 2020-08-15 NOTE — TOC Benefit Eligibility Note (Signed)
Patient Teacher, English as a foreign language completed.    The patient is currently admitted and upon discharge could be taking Entresto 24mg /26mg .  The current 30 day co-pay is, $47.00.   The patient is insured through [place], [place] Patient Advocate Specialist [place] Team Direct Number: [phone]  Fax: [phone]

## 2020-08-15 NOTE — Progress Notes (Signed)
Called pt in room (we do not see covid face to face) and discussed preop ed. PT had practiced sternal precautions with pt (thank you). Pt is practicing IS, performing 2100 mL per pt. Discussed mobility post op and d/c planning. His wife will be with him. Delivered OHS book and careguides to room. Pt receptive. [phone] [place], ACSM 1:05 PM 08/15/2020

## 2020-08-15 NOTE — TOC Benefit Eligibility Note (Signed)
Patient Advocate Encounter  Prior Authorization for Enoxaparin 100 mg/ ml twice daily has been approved.    PA# [id number] Effective dates: 08/15/2020 through 06/01/2021  Patients co-pay is $100.00.     Lyndel Safe, [place] Patient Advocate Specialist [place] Antimicrobial Stewardship Team Direct Number: [phone]  Fax: [phone]

## 2020-08-15 NOTE — TOC Transition Note (Signed)
Transition of Care [facility]) - CM/SW Discharge Note   Patient Details  Name: Lenox Bink MRN: [id number] Date of Birth: 24-Sep-1950  [facility] CM/SW Contact:  Zenon Mayo, RN Phone Number: 08/15/2020, 9:56 AM   Clinical Narrative:    NCM spoke with patient, he is for dc today, he has been educated on lovenox self injections.  He has no other needs.    Final next level of care: Home/Self Care Barriers to Discharge: No Barriers Identified   Patient Goals and CMS Choice        Discharge Placement                       Discharge Plan and Services                                     Social Determinants of Health (SDOH) Interventions     Readmission Risk Interventions No flowsheet data found.

## 2020-08-15 NOTE — Progress Notes (Signed)
TCTS Progress Note  Awaiting CABG--delayed due to + COVID. Plans for discharge acknowledged. I would suggest avoiding Lovenox at home due to nonspecific effects on anticoagulation profile and the artificial constraints of strict blood conservation requirements. I am concerned this would represent a high risk for bleeding about which we could do nothing to reverse perioperatively. Chong Wojdyla Z. Orvan Seen, [place]

## 2020-08-15 NOTE — Progress Notes (Signed)
Pre-CABG workup vascular completed     Please see CV Proc for preliminary results.   Rutger Salton, RVT  

## 2020-08-16 ENCOUNTER — Encounter: Payer: Self-pay | Admitting: Internal Medicine

## 2020-08-18 NOTE — Pre-Procedure Instructions (Signed)
Brian Carpenter  08/18/2020      [facility] [address], [place] [address] Phone: [phone] Fax: [phone]  Brian Carpenter Trans[facility] of [place], [place] - [address] [place] [address] Phone: [phone] Fax: [phone]    Your procedure is scheduled on March 22  Report to [facility] Entrance A at 5:30 A.M.  Call this number if you have problems the morning of surgery:  [phone]   Remember:  Do not eat or drink after midnight.      Take these medicines the morning of surgery with A SIP OF WATER :              Amlodipine (norvasc)             Atorvastatin (lipitor)             Carvedilol (coreg)             Cetirizine (zyrtec)             Isosorbide (isordil)            Sertraline (zoloft)          If needed: tylenol, nitroglycerine    7 days prior to surgery STOP taking any Aspirin (unless otherwise instructed by your surgeon), Aleve, Naproxen, Ibuprofen, Motrin, Advil, Goody's, BC's, all herbal medications, fish oil, and all vitamins.                       How to Manage Your Diabetes Before and After Surgery  Why is it important to control my blood sugar before and after surgery? . Improving blood sugar levels before and after surgery helps healing and can limit problems. . A way of improving blood sugar control is eating a healthy diet by: o  Eating less sugar and carbohydrates o  Increasing activity/exercise o  Talking with your doctor about reaching your blood sugar goals . High blood sugars (greater than 180 mg/dL) can raise your risk of infections and slow your recovery, so you will need to focus on controlling your diabetes during the weeks before surgery. . Make sure that the doctor who takes care of your diabetes knows about your planned surgery including the date and location.  How do I manage my blood sugar before surgery? . Check your  blood sugar at least 4 times a day, starting 2 days before surgery, to make sure that the level is not too high or low. o Check your blood sugar the morning of your surgery when you wake up and every 2 hours until you get to the Short Stay unit. . If your blood sugar is less than 70 mg/dL, you will need to treat for low blood sugar: o Do not take insulin. o Treat a low blood sugar (less than 70 mg/dL) with  cup of clear juice (cranberry or apple), 4 glucose tablets, OR glucose gel. Recheck blood sugar in 15 minutes after treatment (to make sure it is greater than 70 mg/dL). If your blood sugar is not greater than 70 mg/dL on recheck, call [phone] o  for further instructions. . Report your blood sugar to the short stay nurse when you get to Short Stay.  . If you are admitted to the hospital after surgery: o Your blood sugar will be checked by the staff and you will probably be given insulin after surgery (  instead of oral diabetes medicines) to make sure you have good blood sugar levels. o The goal for blood sugar control after surgery is 80-180 mg/dL.       WHAT DO I DO ABOUT MY DIABETES MEDICATION?   Marland Kitchen Do not take oral diabetes medicines (pills) the morning of surgery.   (jardiance/empagliflozin)  . THE NIGHT BEFORE SURGERY, take _____70%______ units of ______NPH_____insulin.       . THE MORNING OF SURGERY, take ______none_______ units of ____NPH______insulin.   . If your CBG is greater than 220 mg/dL, you may take  of your sliding scale (correction) dose of insulin. .                DO NOT TAKE JARDIANCE/EMPAGLIFLOZIN THE DAY BEFORE YOUR SURGERY                 Do not wear jewelry.  Do not wear lotions, powders, or perfumes, or deodorant.  Do not shave 48 hours prior to surgery.  Men may shave face and neck.  Do not bring valuables to the hospital.  [facility] is not responsible for any belongings or valuables.  Contacts, dentures or bridgework may not be worn into  surgery.  Leave your suitcase in the car.  After surgery it may be brought to your room.  For patients admitted to the hospital, discharge time will be determined by your treatment team.  Patients discharged the day of surgery will not be allowed to drive home.    Special instructions:   [place]- Preparing For Surgery  Before surgery, you can play an important role. Because skin is not sterile, your skin needs to be as free of germs as possible. You can reduce the number of germs on your skin by washing with CHG (chlorahexidine gluconate) Soap before surgery.  CHG is an antiseptic cleaner which kills germs and bonds with the skin to continue killing germs even after washing.    Oral Hygiene is also important to reduce your risk of infection.  Remember - BRUSH YOUR TEETH THE MORNING OF SURGERY WITH YOUR REGULAR TOOTHPASTE  Please do not use if you have an allergy to CHG or antibacterial soaps. If your skin becomes reddened/irritated stop using the CHG.  Do not shave (including legs and underarms) for at least 48 hours prior to first CHG shower. It is OK to shave your face.  Please follow these instructions carefully.   1. Shower the NIGHT BEFORE SURGERY and the MORNING OF SURGERY with CHG.   2. If you chose to wash your hair, wash your hair first as usual with your normal shampoo.  3. After you shampoo, rinse your hair and body thoroughly to remove the shampoo.  4. Use CHG as you would any other liquid soap. You can apply CHG directly to the skin and wash gently with a scrungie or a clean washcloth.   5. Apply the CHG Soap to your body ONLY FROM THE NECK DOWN.  Do not use on open wounds or open sores. Avoid contact with your eyes, ears, mouth and genitals (private parts). Wash Face and genitals (private parts)  with your normal soap.  6. Wash thoroughly, paying special attention to the area where your surgery will be performed.  7. Thoroughly rinse your body with warm water from the  neck down.  8. DO NOT shower/wash with your normal soap after using and rinsing off the CHG Soap.  9. Pat yourself dry with a CLEAN TOWEL.  10.  Wear CLEAN PAJAMAS to bed the night before surgery, wear comfortable clothes the morning of surgery  11. Place CLEAN SHEETS on your bed the night of your first shower and DO NOT SLEEP WITH PETS.    Day of Surgery:  Do not apply any deodorants/lotions.  Please wear clean clothes to the hospital/surgery center.   Remember to brush your teeth WITH YOUR REGULAR TOOTHPASTE.    Please read over the following fact sheets that you were given.

## 2020-08-20 ENCOUNTER — Ambulatory Visit (HOSPITAL_COMMUNITY): Admission: RE | Admit: 2020-08-20 | Payer: Medicare PPO | Source: Ambulatory Visit

## 2020-08-20 ENCOUNTER — Encounter (HOSPITAL_COMMUNITY)
Admit: 2020-08-20 | Discharge: 2020-08-20 | Disposition: A | Discharge status: Home / Self Care | Payer: Medicare PPO | Source: Ambulatory Visit | Attending: Cardiothoracic Surgery | Admitting: Cardiothoracic Surgery

## 2020-08-20 ENCOUNTER — Encounter (HOSPITAL_COMMUNITY): Payer: Self-pay

## 2020-08-20 ENCOUNTER — Encounter (HOSPITAL_COMMUNITY): Payer: Self-pay | Admitting: Cardiothoracic Surgery

## 2020-08-20 ENCOUNTER — Other Ambulatory Visit: Payer: Self-pay

## 2020-08-20 DIAGNOSIS — Z01812 Encounter for preprocedural laboratory examination: Secondary | ICD-10-CM | POA: Insufficient documentation

## 2020-08-20 DIAGNOSIS — I2510 Atherosclerotic heart disease of native coronary artery without angina pectoris: Secondary | ICD-10-CM

## 2020-08-20 DIAGNOSIS — Z01818 Encounter for other preprocedural examination: Secondary | ICD-10-CM

## 2020-08-20 HISTORY — DX: Unspecified hearing loss, unspecified ear: H91.90

## 2020-08-20 HISTORY — DX: Depression, unspecified: F32.A

## 2020-08-20 HISTORY — DX: Sleep apnea, unspecified: G47.30

## 2020-08-20 HISTORY — DX: Acute myocardial infarction, unspecified: I21.9

## 2020-08-20 LAB — URINALYSIS, MICROSCOPIC (REFLEX): RBC / HPF: NONE SEEN RBC/hpf (ref 0–5)

## 2020-08-20 LAB — COMPREHENSIVE METABOLIC PANEL
ALT: 28 U/L (ref 0–44)
AST: 29 U/L (ref 15–41)
Albumin: 3.8 g/dL (ref 3.5–5.0)
Alkaline Phosphatase: 67 U/L (ref 38–126)
Anion gap: 10 (ref 5–15)
BUN: 18 mg/dL (ref 8–23)
CO2: 20 mmol/L — ABNORMAL LOW (ref 22–32)
Calcium: 9.8 mg/dL (ref 8.9–10.3)
Chloride: 108 mmol/L (ref 98–111)
Creatinine, Ser: 0.88 mg/dL (ref 0.61–1.24)
GFR, Estimated: >60 mL/min (ref >60)
Glucose, Bld: 186 mg/dL — ABNORMAL HIGH (ref 70–99)
Potassium: 4.6 mmol/L (ref 3.5–5.1)
Sodium: 138 mmol/L (ref 135–145)
Total Bilirubin: 0.8 mg/dL (ref 0.3–1.2)
Total Protein: 7.7 g/dL (ref 6.5–8.1)

## 2020-08-20 LAB — SURGICAL PCR SCREEN
MRSA, PCR: NEGATIVE
Staphylococcus aureus: NEGATIVE

## 2020-08-20 LAB — URINALYSIS, ROUTINE W REFLEX MICROSCOPIC
Bilirubin Urine: NEGATIVE
Glucose, UA: >=500 mg/dL — AB
Hgb urine dipstick: NEGATIVE
Ketones, ur: NEGATIVE mg/dL
Leukocytes,Ua: NEGATIVE
Nitrite: NEGATIVE
Protein, ur: NEGATIVE mg/dL
Specific Gravity, Urine: 1.015 (ref 1.005–1.030)
pH: 6 (ref 5.0–8.0)

## 2020-08-20 LAB — NO BLOOD PRODUCTS

## 2020-08-20 LAB — CBC
HCT: 41.8 % (ref 39.0–52.0)
Hemoglobin: 14 g/dL (ref 13.0–17.0)
MCH: 29.6 pg (ref 26.0–34.0)
MCHC: 33.5 g/dL (ref 30.0–36.0)
MCV: 88.4 fL (ref 80.0–100.0)
Platelets: 228 10*3/uL (ref 150–400)
RBC: 4.73 MIL/uL (ref 4.22–5.81)
RDW: 14.6 % (ref 11.5–15.5)
WBC: 7.1 10*3/uL (ref 4.0–10.5)
nRBC: 0 % (ref 0.0–0.2)

## 2020-08-20 LAB — GLUCOSE, CAPILLARY: Glucose-Capillary: 183 mg/dL — ABNORMAL HIGH (ref 70–99)

## 2020-08-20 LAB — PROTIME-INR
INR: 1 (ref 0.8–1.2)
Prothrombin Time: 12.8 seconds (ref 11.4–15.2)

## 2020-08-20 LAB — BLOOD GAS, ARTERIAL
Acid-base deficit: 1.8 mmol/L (ref 0.0–2.0)
Bicarbonate: 22.2 mmol/L (ref 20.0–28.0)
Drawn by: 60286
FIO2: 21
O2 Saturation: 97.3 %
Patient temperature: 37
pCO2 arterial: 36.2 mmHg (ref 32.0–48.0)
pH, Arterial: 7.405 (ref 7.350–7.450)
pO2, Arterial: 93.8 mmHg (ref 83.0–108.0)

## 2020-08-20 LAB — HEMOGLOBIN A1C
Hgb A1c MFr Bld: 10.7 % — ABNORMAL HIGH (ref 4.8–5.6)
Mean Plasma Glucose: 260.39 mg/dL

## 2020-08-20 LAB — APTT: aPTT: 37 seconds — ABNORMAL HIGH (ref 24–36)

## 2020-08-20 MED ORDER — TRANEXAMIC ACID (OHS) BOLUS VIA INFUSION
15.0000 mg/kg | INTRAVENOUS | Status: AC
  Administered 2020-08-21: 1651.5 mg via INTRAVENOUS
  Filled 2020-08-20: qty 1652

## 2020-08-20 MED ORDER — INSULIN REGULAR(HUMAN) IN NACL 100-0.9 UT/100ML-% IV SOLN
INTRAVENOUS | Status: DC
  Filled 2020-08-20: qty 100

## 2020-08-20 MED ORDER — POTASSIUM CHLORIDE 2 MEQ/ML IV SOLN
80.0000 meq | INTRAVENOUS | Status: DC
  Filled 2020-08-20: qty 40

## 2020-08-20 MED ORDER — SODIUM CHLORIDE 0.9 % IV SOLN
1.5000 g | INTRAVENOUS | Status: DC
  Filled 2020-08-20: qty 1.5

## 2020-08-20 MED ORDER — DEXMEDETOMIDINE HCL IN NACL 400 MCG/100ML IV SOLN
0.1000 ug/kg/h | INTRAVENOUS | Status: DC
  Filled 2020-08-20: qty 100

## 2020-08-20 MED ORDER — NITROGLYCERIN IN D5W 200-5 MCG/ML-% IV SOLN
2.0000 ug/min | INTRAVENOUS | Status: DC
  Filled 2020-08-20: qty 250

## 2020-08-20 MED ORDER — SODIUM CHLORIDE 0.9 % IV SOLN
750.0000 mg | INTRAVENOUS | Status: AC
  Administered 2020-08-21: 750 mg via INTRAVENOUS
  Filled 2020-08-20: qty 750

## 2020-08-20 MED ORDER — SODIUM CHLORIDE 0.9 % IV SOLN
INTRAVENOUS | Status: DC
  Filled 2020-08-20: qty 30

## 2020-08-20 MED ORDER — VANCOMYCIN HCL 1500 MG/300ML IV SOLN
1500.0000 mg | INTRAVENOUS | Status: AC
  Administered 2020-08-21: 1500 mg via INTRAVENOUS
  Filled 2020-08-20: qty 300

## 2020-08-20 MED ORDER — TRANEXAMIC ACID (OHS) PUMP PRIME SOLUTION
2.0000 mg/kg | INTRAVENOUS | Status: DC
  Filled 2020-08-20: qty 2.2

## 2020-08-20 MED ORDER — TRANEXAMIC ACID 1000 MG/10ML IV SOLN
1.5000 mg/kg/h | INTRAVENOUS | Status: AC
  Administered 2020-08-21: 1.5 mg/kg/h via INTRAVENOUS
  Filled 2020-08-20: qty 25

## 2020-08-20 MED ORDER — MAGNESIUM SULFATE 50 % IJ SOLN
40.0000 meq | INTRAMUSCULAR | Status: DC
  Filled 2020-08-20: qty 9.85

## 2020-08-20 MED ORDER — MILRINONE LACTATE IN DEXTROSE 20-5 MG/100ML-% IV SOLN
0.3000 ug/kg/min | INTRAVENOUS | Status: DC
  Filled 2020-08-20: qty 100

## 2020-08-20 MED ORDER — EPINEPHRINE HCL 5 MG/250ML IV SOLN IN NS
0.0000 ug/min | INTRAVENOUS | Status: DC
  Filled 2020-08-20: qty 250

## 2020-08-20 MED ORDER — PHENYLEPHRINE HCL-NACL 20-0.9 MG/250ML-% IV SOLN
30.0000 ug/min | INTRAVENOUS | Status: DC
  Filled 2020-08-20: qty 250

## 2020-08-20 MED ORDER — NOREPINEPHRINE 4 MG/250ML-% IV SOLN
0.0000 ug/min | INTRAVENOUS | Status: DC
  Filled 2020-08-20: qty 250

## 2020-08-20 MED ORDER — PLASMA-LYTE 148 IV SOLN
INTRAVENOUS | Status: DC
  Filled 2020-08-20: qty 2.5

## 2020-08-20 NOTE — Anesthesia Preprocedure Evaluation (Addendum)
Anesthesia Evaluation  Patient identified by MRN, date of birth, ID band Patient awake    Reviewed: Allergy & Precautions, NPO status , Patient's Chart, lab work & pertinent test results  Airway Mallampati: II  TM Distance: >3 FB Neck ROM: Full    Dental  (+) Dental Advisory Given, Edentulous Lower, Edentulous Upper   Pulmonary sleep apnea ,  COVID+ 08/08/20   Pulmonary exam normal breath sounds clear to auscultation       Cardiovascular hypertension, + Past MI  Normal cardiovascular exam Rhythm:Regular Rate:Normal  Echo 08/09/20: 1. Left ventricular ejection fraction, by estimation, is 35 to 40%. The  left ventricle has moderately decreased function. The left ventricle  demonstrates global hypokinesis. The left ventricular internal cavity size  was mildly dilated. There is mild  concentric left ventricular hypertrophy. Left ventricular diastolic  parameters are consistent with Grade I diastolic dysfunction (impaired  relaxation).  2. Right ventricular systolic function is normal. The right ventricular  size is normal.  3. Left atrial size was severely dilated.  4. Right atrial size was moderately dilated.  5. The mitral valve is degenerative. Mild mitral valve regurgitation. No  evidence of mitral stenosis.  6. The aortic valve is tricuspid. Aortic valve regurgitation is not  visualized. Mild aortic valve sclerosis is present, with no evidence of  aortic valve stenosis.  7. The inferior vena cava is normal in size with greater than 50%  respiratory variability, suggesting right atrial pressure of 3 mmHg.   Neuro/Psych PSYCHIATRIC DISORDERS Depression negative neurological ROS     GI/Hepatic Neg liver ROS, H/o gastric bypass   Endo/Other  diabetes, Type 2Obesity   Renal/GU negative Renal ROS     Musculoskeletal negative musculoskeletal ROS (+)   Abdominal   Peds  Hematology  (+) REFUSES BLOOD PRODUCTS,  JEHOVAH'S WITNESS  Anesthesia Other Findings   Reproductive/Obstetrics                            Anesthesia Physical Anesthesia Plan  ASA: IV  Anesthesia Plan: General   Post-op Pain Management:    Induction: Intravenous  PONV Risk Score and Plan: 2 and Treatment may vary due to age or medical condition and Midazolam  Airway Management Planned: Oral ETT  Additional Equipment: Arterial line, PA Cath, CVP, TEE and Ultrasound Guidance Line Placement  Intra-op Plan:   Post-operative Plan: Post-operative intubation/ventilation  Informed Consent: I have reviewed the patients History and Physical, chart, labs and discussed the procedure including the risks, benefits and alternatives for the proposed anesthesia with the patient or authorized representative who has indicated his/her understanding and acceptance.     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  Patient declines all blood products other than cell saver.)      Anesthesia Quick Evaluation

## 2020-08-20 NOTE — Progress Notes (Addendum)
Patient denies shortness of breath, fever, cough or chest pain.  PCP - Dr Emelda Fear Cardiologist - n/a  Chest x-ray - 08/08/20 EKG - 08/11/20 Stress Test - n/a ECHO - 08/09/20 Cardiac Cath - 3/17/2a  Sleep Study -  Yes CPAP - does not use cpap  Fasting Blood Sugar - 140-160s Checks Blood Sugar 2 times a day  . THE NIGHT BEFORE SURGERY, take 70% of insulin dose (4-14 units).      . THE MORNING OF SURGERY, take NONE  . If your CBG is greater than 220 mg/dL, you may take  of your sliding scale (correction) dose of insulin.  . If your blood sugar is less than 70 mg/dL, you will need to treat for low blood sugar: o Treat a low blood sugar (less than 70 mg/dL) with  cup of clear juice (cranberry or apple), 4 glucose tablets, OR glucose gel. o Recheck blood sugar in 15 minutes after treatment (to make sure it is greater than 70 mg/dL). If your blood sugar is not greater than 70 mg/dL on recheck, call [phone] for further instructions.  Blood Thinner Instructions:  Last day of lovenox was on 08/19/20..  Aspirin Instructions: Follow your surgeon's instructions on when to stop aspirin prior to surgery,  If no instructions were given by your surgeon then you will need to call the office for those instructions.  Anesthesia review: Yes  STOP now taking any Aspirin (unless otherwise instructed by your surgeon), Aleve, Naproxen, Ibuprofen, Motrin, Advil, Goody's, BC's, all herbal medications, fish oil, and all vitamins.   Coronavirus Screening Positive covid test on 08/08/20.  Do you have any of the following symptoms:  Cough yes/no: No Fever (>100.65F)  yes/no: No Runny nose yes/no: No Sore throat yes/no: No Difficulty breathing/shortness of breath  yes/no: No  Have you traveled in the last 14 days and where? yes/no: No  Patient verbalized understanding of instructions that were given to them at the PAT appointment. Patient was also instructed that they will need to review over the  PAT instructions again at home before surgery.

## 2020-08-20 NOTE — Progress Notes (Signed)
Reviews patient's medical history with Dr Gifford Shave.  Patient is scheduled for surgery tomorrow at 0730 for Coronary Artery Bypass Graft.  [place] for surgery per Dr Gifford Shave.

## 2020-08-20 NOTE — Pre-Procedure Instructions (Signed)
Lebert Lovern  08/20/2020      [facility] [address], [place] [address] Phone: [phone] Fax: [phone]  [facility] [place], [place] - [address] [place] [address] Phone: [phone] Fax: [phone]    Your procedure is scheduled on March 22  Report to [facility] Entrance A at 5:30 A.M.  Call this number if you have problems the morning of surgery:  [phone]   Remember:  Do not eat or drink after midnight tonight.      Take these medicines the morning of surgery with A SIP OF WATER :              Amlodipine (norvasc)             Atorvastatin (lipitor)             Carvedilol (coreg)             Cetirizine (zyrtec)             Isosorbide (isordil)            Sertraline (zoloft)          If needed: tylenol, nitroglycerine    7 days prior to surgery STOP taking any Aspirin (unless otherwise instructed by your surgeon), Aleve, Naproxen, Ibuprofen, Motrin, Advil, Goody's, BC's, all herbal medications, fish oil, and all vitamins.                       How to Manage Your Diabetes Before and After Surgery  Why is it important to control my blood sugar before and after surgery? . Improving blood sugar levels before and after surgery helps healing and can limit problems. . A way of improving blood sugar control is eating a healthy diet by: o  Eating less sugar and carbohydrates o  Increasing activity/exercise o  Talking with your doctor about reaching your blood sugar goals . High blood sugars (greater than 180 mg/dL) can raise your risk of infections and slow your recovery, so you will need to focus on controlling your diabetes during the weeks before surgery. . Make sure that the doctor who takes care of your diabetes knows about your planned surgery including the date and location.  How do I manage my blood sugar before surgery? . Check  your blood sugar at least 4 times a day, starting 2 days before surgery, to make sure that the level is not too high or low. o Check your blood sugar the morning of your surgery when you wake up and every 2 hours until you get to the Short Stay unit. . If your blood sugar is less than 70 mg/dL, you will need to treat for low blood sugar: o Do not take insulin. o Treat a low blood sugar (less than 70 mg/dL) with  cup of clear juice (cranberry or apple), 4 glucose tablets, OR glucose gel. Recheck blood sugar in 15 minutes after treatment (to make sure it is greater than 70 mg/dL). If your blood sugar is not greater than 70 mg/dL on recheck, call [phone] o  for further instructions. . Report your blood sugar to the short stay nurse when you get to Short Stay.  . If you are admitted to the hospital after surgery: o Your blood sugar will be checked by the staff and you will probably be given insulin after  surgery (instead of oral diabetes medicines) to make sure you have good blood sugar levels. o The goal for blood sugar control after surgery is 80-180 mg/dL.       WHAT DO I DO ABOUT MY DIABETES MEDICATION?   Marland Kitchen Do not take oral diabetes medicines (pills) the morning of surgery.   (jardiance)  . THE NIGHT BEFORE SURGERY, take _____70% (4-14 units)__of NPH insulin.      . THE MORNING OF SURGERY, take ______none_______ units of ____NPH______insulin.   . If your CBG is greater than 220 mg/dL, you may take  of your sliding scale (correction) dose of insulin.                DO NOT TAKE JARDIANCE/EMPAGLIFLOZIN THE DAY BEFORE YOUR SURGERY                 Do not wear jewelry.  Do not wear lotions, powders, colognes or deodorant.  Men may shave face and neck.  Do not bring valuables to the hospital.  [facility] is not responsible for any belongings or valuables.  Contacts, dentures or bridgework may not be worn into surgery.  Leave your suitcase in the car.  After surgery it may be  brought to your room.  For patients admitted to the hospital, discharge time will be determined by your treatment team.   Special instructions:   [facility]- Preparing For Surgery  Before surgery, you can play an important role. Because skin is not sterile, your skin needs to be as free of germs as possible. You can reduce the number of germs on your skin by washing with CHG (chlorahexidine gluconate) Soap before surgery.  CHG is an antiseptic cleaner which kills germs and bonds with the skin to continue killing germs even after washing.    Oral Hygiene is also important to reduce your risk of infection.  Remember - BRUSH YOUR TEETH THE MORNING OF SURGERY WITH YOUR REGULAR TOOTHPASTE  Please do not use if you have an allergy to CHG or antibacterial soaps. If your skin becomes reddened/irritated stop using the CHG.  Do not shave (including legs and underarms) for at least 48 hours prior to first CHG shower. It is OK to shave your face.  Please follow these instructions carefully.   1. Shower the NIGHT BEFORE SURGERY-Mon and the MORNING OF SURGERY-Tues with CHG.   2. If you chose to wash your hair, wash your hair first as usual with your normal shampoo.  3. After you shampoo, rinse your hair and body thoroughly to remove the shampoo.  4. Use CHG as you would any other liquid soap. You can apply CHG directly to the skin and wash gently with a scrungie or a clean washcloth.   5. Apply the CHG Soap to your body ONLY FROM THE NECK DOWN.  Do not use on open wounds or open sores. Avoid contact with your eyes, ears, mouth and genitals (private parts). Wash Face and genitals (private parts)  with your normal soap.  6. Wash thoroughly, paying special attention to the area where your surgery will be performed.  7. Thoroughly rinse your body with warm water from the neck down.  8. DO NOT shower/wash with your normal soap after using and rinsing off the CHG Soap.  9. Pat yourself dry with a CLEAN  TOWEL.  10. Wear CLEAN PAJAMAS to bed the night before surgery, wear comfortable clothes the morning of surgery  11. Place CLEAN SHEETS on your bed the  night of your first shower and DO NOT SLEEP WITH PETS.    Day of Surgery:  Do not apply any deodorants/lotions.  Please wear clean clothes to the hospital/surgery center.   Remember to brush your teeth WITH YOUR REGULAR TOOTHPASTE.    Please read over the following fact sheets that you were given.

## 2020-08-21 ENCOUNTER — Inpatient Hospital Stay (HOSPITAL_COMMUNITY): Payer: Medicare PPO | Admitting: Anesthesiology

## 2020-08-21 ENCOUNTER — Inpatient Hospital Stay (HOSPITAL_COMMUNITY): Payer: Medicare PPO

## 2020-08-21 ENCOUNTER — Inpatient Hospital Stay (HOSPITAL_COMMUNITY)
Admission: RE | Admit: 2020-08-21 | Discharge: 2020-08-27 | DRG: 236 | Disposition: A | Discharge status: Home / Self Care | Payer: Medicare PPO | Attending: Cardiothoracic Surgery | Admitting: Cardiothoracic Surgery

## 2020-08-21 ENCOUNTER — Inpatient Hospital Stay (HOSPITAL_COMMUNITY)
Admission: RE | Disposition: A | Discharge status: Home / Self Care | Payer: Self-pay | Source: Home / Self Care | Attending: Cardiothoracic Surgery

## 2020-08-21 ENCOUNTER — Other Ambulatory Visit: Payer: Self-pay

## 2020-08-21 ENCOUNTER — Encounter (HOSPITAL_COMMUNITY): Payer: Self-pay | Admitting: Cardiothoracic Surgery

## 2020-08-21 DIAGNOSIS — E669 Obesity, unspecified: Secondary | ICD-10-CM | POA: Diagnosis present

## 2020-08-21 DIAGNOSIS — E782 Mixed hyperlipidemia: Secondary | ICD-10-CM | POA: Diagnosis present

## 2020-08-21 DIAGNOSIS — Z8616 Personal history of COVID-19: Secondary | ICD-10-CM | POA: Diagnosis not present

## 2020-08-21 DIAGNOSIS — G4730 Sleep apnea, unspecified: Secondary | ICD-10-CM | POA: Diagnosis present

## 2020-08-21 DIAGNOSIS — Z951 Presence of aortocoronary bypass graft: Secondary | ICD-10-CM

## 2020-08-21 DIAGNOSIS — Z6832 Body mass index (BMI) 32.0-32.9, adult: Secondary | ICD-10-CM | POA: Diagnosis not present

## 2020-08-21 DIAGNOSIS — I252 Old myocardial infarction: Secondary | ICD-10-CM | POA: Diagnosis not present

## 2020-08-21 DIAGNOSIS — E119 Type 2 diabetes mellitus without complications: Secondary | ICD-10-CM | POA: Diagnosis present

## 2020-08-21 DIAGNOSIS — Z9889 Other specified postprocedural states: Secondary | ICD-10-CM

## 2020-08-21 DIAGNOSIS — Z794 Long term (current) use of insulin: Secondary | ICD-10-CM | POA: Diagnosis not present

## 2020-08-21 DIAGNOSIS — Z9884 Bariatric surgery status: Secondary | ICD-10-CM | POA: Diagnosis not present

## 2020-08-21 DIAGNOSIS — Z79899 Other long term (current) drug therapy: Secondary | ICD-10-CM | POA: Diagnosis not present

## 2020-08-21 DIAGNOSIS — I2510 Atherosclerotic heart disease of native coronary artery without angina pectoris: Secondary | ICD-10-CM | POA: Diagnosis not present

## 2020-08-21 DIAGNOSIS — Z7982 Long term (current) use of aspirin: Secondary | ICD-10-CM

## 2020-08-21 DIAGNOSIS — I25119 Atherosclerotic heart disease of native coronary artery with unspecified angina pectoris: Secondary | ICD-10-CM | POA: Diagnosis present

## 2020-08-21 DIAGNOSIS — I440 Atrioventricular block, first degree: Secondary | ICD-10-CM | POA: Diagnosis not present

## 2020-08-21 DIAGNOSIS — I10 Essential (primary) hypertension: Secondary | ICD-10-CM | POA: Diagnosis present

## 2020-08-21 DIAGNOSIS — I4891 Unspecified atrial fibrillation: Secondary | ICD-10-CM | POA: Diagnosis present

## 2020-08-21 HISTORY — PX: TEE WITHOUT CARDIOVERSION: SHX5443

## 2020-08-21 HISTORY — PX: RADIAL ARTERY HARVEST: SHX5067

## 2020-08-21 HISTORY — PX: CORONARY ARTERY BYPASS GRAFT: SHX141

## 2020-08-21 LAB — POCT I-STAT, CHEM 8
BUN: 18 mg/dL (ref 8–23)
BUN: 17 mg/dL (ref 8–23)
BUN: 15 mg/dL (ref 8–23)
BUN: 15 mg/dL (ref 8–23)
BUN: 15 mg/dL (ref 8–23)
Calcium, Ion: 1.33 mmol/L (ref 1.15–1.40)
Calcium, Ion: 1.34 mmol/L (ref 1.15–1.40)
Calcium, Ion: 1.22 mmol/L (ref 1.15–1.40)
Calcium, Ion: 1.24 mmol/L (ref 1.15–1.40)
Calcium, Ion: 1.44 mmol/L — ABNORMAL HIGH (ref 1.15–1.40)
Chloride: 108 mmol/L (ref 98–111)
Chloride: 109 mmol/L (ref 98–111)
Chloride: 108 mmol/L (ref 98–111)
Chloride: 107 mmol/L (ref 98–111)
Chloride: 107 mmol/L (ref 98–111)
Creatinine, Ser: 0.7 mg/dL (ref 0.61–1.24)
Creatinine, Ser: 0.7 mg/dL (ref 0.61–1.24)
Creatinine, Ser: 0.6 mg/dL — ABNORMAL LOW (ref 0.61–1.24)
Creatinine, Ser: 0.6 mg/dL — ABNORMAL LOW (ref 0.61–1.24)
Creatinine, Ser: 0.6 mg/dL — ABNORMAL LOW (ref 0.61–1.24)
Glucose, Bld: 119 mg/dL — ABNORMAL HIGH (ref 70–99)
Glucose, Bld: 88 mg/dL (ref 70–99)
Glucose, Bld: 67 mg/dL — ABNORMAL LOW (ref 70–99)
Glucose, Bld: 133 mg/dL — ABNORMAL HIGH (ref 70–99)
Glucose, Bld: 115 mg/dL — ABNORMAL HIGH (ref 70–99)
HCT: 38 % — ABNORMAL LOW (ref 39.0–52.0)
HCT: 32 % — ABNORMAL LOW (ref 39.0–52.0)
HCT: 28 % — ABNORMAL LOW (ref 39.0–52.0)
HCT: 28 % — ABNORMAL LOW (ref 39.0–52.0)
HCT: 30 % — ABNORMAL LOW (ref 39.0–52.0)
Hemoglobin: 12.9 g/dL — ABNORMAL LOW (ref 13.0–17.0)
Hemoglobin: 10.9 g/dL — ABNORMAL LOW (ref 13.0–17.0)
Hemoglobin: 9.5 g/dL — ABNORMAL LOW (ref 13.0–17.0)
Hemoglobin: 9.5 g/dL — ABNORMAL LOW (ref 13.0–17.0)
Hemoglobin: 10.2 g/dL — ABNORMAL LOW (ref 13.0–17.0)
Potassium: 4 mmol/L (ref 3.5–5.1)
Potassium: 4 mmol/L (ref 3.5–5.1)
Potassium: 4.4 mmol/L (ref 3.5–5.1)
Potassium: 4.3 mmol/L (ref 3.5–5.1)
Potassium: 4 mmol/L (ref 3.5–5.1)
Sodium: 144 mmol/L (ref 135–145)
Sodium: 143 mmol/L (ref 135–145)
Sodium: 144 mmol/L (ref 135–145)
Sodium: 142 mmol/L (ref 135–145)
Sodium: 142 mmol/L (ref 135–145)
TCO2: 28 mmol/L (ref 22–32)
TCO2: 24 mmol/L (ref 22–32)
TCO2: 24 mmol/L (ref 22–32)
TCO2: 25 mmol/L (ref 22–32)
TCO2: 26 mmol/L (ref 22–32)

## 2020-08-21 LAB — POCT I-STAT EG7
Acid-base deficit: 1 mmol/L (ref 0.0–2.0)
Bicarbonate: 24.2 mmol/L (ref 20.0–28.0)
Calcium, Ion: 1.15 mmol/L (ref 1.15–1.40)
HCT: 29 % — ABNORMAL LOW (ref 39.0–52.0)
Hemoglobin: 9.9 g/dL — ABNORMAL LOW (ref 13.0–17.0)
O2 Saturation: 81 %
Potassium: 3.7 mmol/L (ref 3.5–5.1)
Sodium: 144 mmol/L (ref 135–145)
TCO2: 25 mmol/L (ref 22–32)
pCO2, Ven: 39.1 mmHg — ABNORMAL LOW (ref 44.0–60.0)
pH, Ven: 7.4 (ref 7.250–7.430)
pO2, Ven: 45 mmHg (ref 32.0–45.0)

## 2020-08-21 LAB — CBC
HCT: 38.5 % — ABNORMAL LOW (ref 39.0–52.0)
HCT: 39.7 % (ref 39.0–52.0)
Hemoglobin: 12.3 g/dL — ABNORMAL LOW (ref 13.0–17.0)
Hemoglobin: 12.9 g/dL — ABNORMAL LOW (ref 13.0–17.0)
MCH: 29.4 pg (ref 26.0–34.0)
MCH: 29.5 pg (ref 26.0–34.0)
MCHC: 31.9 g/dL (ref 30.0–36.0)
MCHC: 32.5 g/dL (ref 30.0–36.0)
MCV: 92.1 fL (ref 80.0–100.0)
MCV: 90.6 fL (ref 80.0–100.0)
Platelets: 143 10*3/uL — ABNORMAL LOW (ref 150–400)
Platelets: 204 10*3/uL (ref 150–400)
RBC: 4.18 MIL/uL — ABNORMAL LOW (ref 4.22–5.81)
RBC: 4.38 MIL/uL (ref 4.22–5.81)
RDW: 14.7 % (ref 11.5–15.5)
RDW: 14.9 % (ref 11.5–15.5)
WBC: 8.4 10*3/uL (ref 4.0–10.5)
WBC: 14.1 10*3/uL — ABNORMAL HIGH (ref 4.0–10.5)
nRBC: 0 % (ref 0.0–0.2)
nRBC: 0 % (ref 0.0–0.2)

## 2020-08-21 LAB — POCT I-STAT 7, (LYTES, BLD GAS, ICA,H+H)
Acid-Base Excess: 0 mmol/L (ref 0.0–2.0)
Acid-Base Excess: 1 mmol/L (ref 0.0–2.0)
Acid-Base Excess: 3 mmol/L — ABNORMAL HIGH (ref 0.0–2.0)
Acid-base deficit: 2 mmol/L (ref 0.0–2.0)
Bicarbonate: 24.7 mmol/L (ref 20.0–28.0)
Bicarbonate: 25.4 mmol/L (ref 20.0–28.0)
Bicarbonate: 27.4 mmol/L (ref 20.0–28.0)
Bicarbonate: 23.5 mmol/L (ref 20.0–28.0)
Calcium, Ion: 1.06 mmol/L — ABNORMAL LOW (ref 1.15–1.40)
Calcium, Ion: 1.18 mmol/L (ref 1.15–1.40)
Calcium, Ion: 1.44 mmol/L — ABNORMAL HIGH (ref 1.15–1.40)
Calcium, Ion: 1.36 mmol/L (ref 1.15–1.40)
HCT: 27 % — ABNORMAL LOW (ref 39.0–52.0)
HCT: 28 % — ABNORMAL LOW (ref 39.0–52.0)
HCT: 29 % — ABNORMAL LOW (ref 39.0–52.0)
HCT: 35 % — ABNORMAL LOW (ref 39.0–52.0)
Hemoglobin: 9.2 g/dL — ABNORMAL LOW (ref 13.0–17.0)
Hemoglobin: 9.5 g/dL — ABNORMAL LOW (ref 13.0–17.0)
Hemoglobin: 9.9 g/dL — ABNORMAL LOW (ref 13.0–17.0)
Hemoglobin: 11.9 g/dL — ABNORMAL LOW (ref 13.0–17.0)
O2 Saturation: 100 %
O2 Saturation: 100 %
O2 Saturation: 100 %
O2 Saturation: 99 %
Patient temperature: 35.7
Potassium: 3.8 mmol/L (ref 3.5–5.1)
Potassium: 4.1 mmol/L (ref 3.5–5.1)
Potassium: 4.2 mmol/L (ref 3.5–5.1)
Potassium: 4.1 mmol/L (ref 3.5–5.1)
Sodium: 143 mmol/L (ref 135–145)
Sodium: 141 mmol/L (ref 135–145)
Sodium: 141 mmol/L (ref 135–145)
Sodium: 143 mmol/L (ref 135–145)
TCO2: 26 mmol/L (ref 22–32)
TCO2: 27 mmol/L (ref 22–32)
TCO2: 29 mmol/L (ref 22–32)
TCO2: 25 mmol/L (ref 22–32)
pCO2 arterial: 39.2 mmHg (ref 32.0–48.0)
pCO2 arterial: 40.2 mmHg (ref 32.0–48.0)
pCO2 arterial: 40.3 mmHg (ref 32.0–48.0)
pCO2 arterial: 40.6 mmHg (ref 32.0–48.0)
pH, Arterial: 7.408 (ref 7.350–7.450)
pH, Arterial: 7.408 (ref 7.350–7.450)
pH, Arterial: 7.442 (ref 7.350–7.450)
pH, Arterial: 7.364 (ref 7.350–7.450)
pO2, Arterial: 340 mmHg — ABNORMAL HIGH (ref 83.0–108.0)
pO2, Arterial: 313 mmHg — ABNORMAL HIGH (ref 83.0–108.0)
pO2, Arterial: 333 mmHg — ABNORMAL HIGH (ref 83.0–108.0)
pO2, Arterial: 134 mmHg — ABNORMAL HIGH (ref 83.0–108.0)

## 2020-08-21 LAB — HEMOGLOBIN AND HEMATOCRIT, BLOOD
HCT: 29.7 % — ABNORMAL LOW (ref 39.0–52.0)
Hemoglobin: 9.8 g/dL — ABNORMAL LOW (ref 13.0–17.0)

## 2020-08-21 LAB — COMPREHENSIVE METABOLIC PANEL
ALT: 26 U/L (ref 0–44)
AST: 71 U/L — ABNORMAL HIGH (ref 15–41)
Albumin: 2.9 g/dL — ABNORMAL LOW (ref 3.5–5.0)
Alkaline Phosphatase: 49 U/L (ref 38–126)
Anion gap: 6 (ref 5–15)
BUN: 13 mg/dL (ref 8–23)
CO2: 20 mmol/L — ABNORMAL LOW (ref 22–32)
Calcium: 8.6 mg/dL — ABNORMAL LOW (ref 8.9–10.3)
Chloride: 110 mmol/L (ref 98–111)
Creatinine, Ser: 0.82 mg/dL (ref 0.61–1.24)
GFR, Estimated: >60 mL/min (ref >60)
Glucose, Bld: 130 mg/dL — ABNORMAL HIGH (ref 70–99)
Potassium: 4.3 mmol/L (ref 3.5–5.1)
Sodium: 136 mmol/L (ref 135–145)
Total Bilirubin: 1.2 mg/dL (ref 0.3–1.2)
Total Protein: 5.9 g/dL — ABNORMAL LOW (ref 6.5–8.1)

## 2020-08-21 LAB — GLUCOSE, CAPILLARY
Glucose-Capillary: 132 mg/dL — ABNORMAL HIGH (ref 70–99)
Glucose-Capillary: 157 mg/dL — ABNORMAL HIGH (ref 70–99)
Glucose-Capillary: 81 mg/dL (ref 70–99)
Glucose-Capillary: 88 mg/dL (ref 70–99)
Glucose-Capillary: 130 mg/dL — ABNORMAL HIGH (ref 70–99)
Glucose-Capillary: 79 mg/dL (ref 70–99)
Glucose-Capillary: 102 mg/dL — ABNORMAL HIGH (ref 70–99)
Glucose-Capillary: 136 mg/dL — ABNORMAL HIGH (ref 70–99)
Glucose-Capillary: 134 mg/dL — ABNORMAL HIGH (ref 70–99)
Glucose-Capillary: 140 mg/dL — ABNORMAL HIGH (ref 70–99)
Glucose-Capillary: 165 mg/dL — ABNORMAL HIGH (ref 70–99)
Glucose-Capillary: 143 mg/dL — ABNORMAL HIGH (ref 70–99)

## 2020-08-21 LAB — PHOSPHORUS: Phosphorus: 2.4 mg/dL — ABNORMAL LOW (ref 2.5–4.6)

## 2020-08-21 LAB — MAGNESIUM: Magnesium: 2.5 mg/dL — ABNORMAL HIGH (ref 1.7–2.4)

## 2020-08-21 LAB — ECHO INTRAOPERATIVE TEE
Height: 73 in
Weight: 3888 oz

## 2020-08-21 LAB — APTT: aPTT: 37 seconds — ABNORMAL HIGH (ref 24–36)

## 2020-08-21 LAB — PROTIME-INR
INR: 1.2 (ref 0.8–1.2)
Prothrombin Time: 15.1 seconds (ref 11.4–15.2)

## 2020-08-21 LAB — PLATELET COUNT: Platelets: 176 10*3/uL (ref 150–400)

## 2020-08-21 SURGERY — CORONARY ARTERY BYPASS GRAFTING (CABG)
Anesthesia: General | Site: Chest

## 2020-08-21 MED ORDER — SODIUM CHLORIDE 0.9 % IV SOLN
INTRAVENOUS | Status: DC | PRN
  Administered 2020-08-21: 1.5 g via INTRAVENOUS

## 2020-08-21 MED ORDER — PANTOPRAZOLE SODIUM 40 MG PO TBEC
40.0000 mg | DELAYED_RELEASE_TABLET | Freq: Every day | ORAL | Status: DC
  Administered 2020-08-23 – 2020-08-27 (×5): 40 mg via ORAL
  Filled 2020-08-21 (×5): qty 1

## 2020-08-21 MED ORDER — LACTATED RINGERS IV SOLN: INTRAVENOUS | Status: DC | PRN

## 2020-08-21 MED ORDER — ACETAMINOPHEN 160 MG/5ML PO SOLN: 1000.0000 mg | Freq: Four times a day (QID) | ORAL | Status: AC

## 2020-08-21 MED ORDER — DEXTROSE 50 % IV SOLN
INTRAVENOUS | Status: AC
  Filled 2020-08-21: qty 50

## 2020-08-21 MED ORDER — POTASSIUM CHLORIDE 10 MEQ/50ML IV SOLN: 10.0000 meq | INTRAVENOUS | Status: AC

## 2020-08-21 MED ORDER — NITROGLYCERIN IN D5W 200-5 MCG/ML-% IV SOLN
INTRAVENOUS | Status: DC | PRN
  Administered 2020-08-21: 10 ug/min via INTRAVENOUS

## 2020-08-21 MED ORDER — ORAL CARE MOUTH RINSE: 15.0000 mL | Freq: Two times a day (BID) | OROMUCOSAL | Status: DC

## 2020-08-21 MED ORDER — PROPOFOL 10 MG/ML IV BOLUS
INTRAVENOUS | Status: DC | PRN
  Administered 2020-08-21: 50 mg via INTRAVENOUS

## 2020-08-21 MED ORDER — CHLORHEXIDINE GLUCONATE 4 % EX LIQD: 30.0000 mL | CUTANEOUS | Status: DC

## 2020-08-21 MED ORDER — MAGNESIUM SULFATE 4 GM/100ML IV SOLN
4.0000 g | Freq: Once | INTRAVENOUS | Status: AC
  Administered 2020-08-21: 4 g via INTRAVENOUS
  Filled 2020-08-21: qty 100

## 2020-08-21 MED ORDER — BISACODYL 5 MG PO TBEC
10.0000 mg | DELAYED_RELEASE_TABLET | Freq: Every day | ORAL | Status: DC
  Administered 2020-08-22 – 2020-08-27 (×4): 10 mg via ORAL
  Filled 2020-08-21 (×5): qty 2

## 2020-08-21 MED ORDER — METOCLOPRAMIDE HCL 5 MG/ML IJ SOLN
10.0000 mg | Freq: Four times a day (QID) | INTRAMUSCULAR | Status: AC
  Administered 2020-08-21 – 2020-08-22 (×3): 10 mg via INTRAVENOUS
  Filled 2020-08-21 (×3): qty 2

## 2020-08-21 MED ORDER — INSULIN REGULAR(HUMAN) IN NACL 100-0.9 UT/100ML-% IV SOLN
INTRAVENOUS | Status: DC | PRN
  Administered 2020-08-21: 2.6 [IU]/h via INTRAVENOUS

## 2020-08-21 MED ORDER — CHLORHEXIDINE GLUCONATE CLOTH 2 % EX PADS
6.0000 | MEDICATED_PAD | Freq: Every day | CUTANEOUS | Status: DC
  Administered 2020-08-21 – 2020-08-23 (×3): 6 via TOPICAL

## 2020-08-21 MED ORDER — FENTANYL CITRATE (PF) 250 MCG/5ML IJ SOLN
INTRAMUSCULAR | Status: AC
  Filled 2020-08-21: qty 25

## 2020-08-21 MED ORDER — PROTAMINE SULFATE 10 MG/ML IV SOLN
INTRAVENOUS | Status: DC | PRN
  Administered 2020-08-21 (×3): 50 mg via INTRAVENOUS
  Administered 2020-08-21: 30 mg via INTRAVENOUS
  Administered 2020-08-21 (×3): 50 mg via INTRAVENOUS

## 2020-08-21 MED ORDER — LIDOCAINE 2% (20 MG/ML) 5 ML SYRINGE
INTRAMUSCULAR | Status: DC | PRN
  Administered 2020-08-21: 100 mg via INTRAVENOUS

## 2020-08-21 MED ORDER — ONDANSETRON HCL 4 MG/2ML IJ SOLN
4.0000 mg | Freq: Four times a day (QID) | INTRAMUSCULAR | Status: DC | PRN
  Administered 2020-08-23: 4 mg via INTRAVENOUS
  Filled 2020-08-21: qty 2

## 2020-08-21 MED ORDER — METOPROLOL TARTRATE 25 MG/10 ML ORAL SUSPENSION
12.5000 mg | Freq: Two times a day (BID) | ORAL | Status: DC
  Filled 2020-08-21 (×9): qty 5

## 2020-08-21 MED ORDER — SODIUM CHLORIDE 0.9% FLUSH: 3.0000 mL | INTRAVENOUS | Status: DC | PRN

## 2020-08-21 MED ORDER — SODIUM CHLORIDE 0.9 % IV SOLN: INTRAVENOUS | Status: DC

## 2020-08-21 MED ORDER — ROCURONIUM BROMIDE 10 MG/ML (PF) SYRINGE
PREFILLED_SYRINGE | INTRAVENOUS | Status: AC
  Filled 2020-08-21: qty 30

## 2020-08-21 MED ORDER — VANCOMYCIN HCL 1000 MG IV SOLR
INTRAVENOUS | Status: AC
  Filled 2020-08-21: qty 3000

## 2020-08-21 MED ORDER — ACETAMINOPHEN 500 MG PO TABS
1000.0000 mg | ORAL_TABLET | Freq: Four times a day (QID) | ORAL | Status: AC
  Administered 2020-08-22 – 2020-08-26 (×15): 1000 mg via ORAL
  Filled 2020-08-21 (×16): qty 2

## 2020-08-21 MED ORDER — STERILE WATER FOR INJECTION IJ SOLN
INTRAMUSCULAR | Status: DC | PRN
  Administered 2020-08-21: 10 mL

## 2020-08-21 MED ORDER — ACETAMINOPHEN 650 MG RE SUPP
650.0000 mg | Freq: Once | RECTAL | Status: AC
  Administered 2020-08-21: 650 mg via RECTAL

## 2020-08-21 MED ORDER — CHLORHEXIDINE GLUCONATE CLOTH 2 % EX PADS: 6.0000 | MEDICATED_PAD | Freq: Every day | CUTANEOUS | Status: DC

## 2020-08-21 MED ORDER — LIDOCAINE 2% (20 MG/ML) 5 ML SYRINGE
INTRAMUSCULAR | Status: AC
  Filled 2020-08-21: qty 5

## 2020-08-21 MED ORDER — PHENYLEPHRINE HCL-NACL 20-0.9 MG/250ML-% IV SOLN
INTRAVENOUS | Status: DC | PRN
  Administered 2020-08-21: 20 ug/min via INTRAVENOUS

## 2020-08-21 MED ORDER — HEPARIN SODIUM (PORCINE) 1000 UNIT/ML IJ SOLN
INTRAMUSCULAR | Status: DC | PRN
  Administered 2020-08-21: 33000 [IU] via INTRAVENOUS

## 2020-08-21 MED ORDER — METOPROLOL TARTRATE 12.5 MG HALF TABLET
12.5000 mg | ORAL_TABLET | Freq: Two times a day (BID) | ORAL | Status: DC
  Administered 2020-08-21 – 2020-08-27 (×12): 12.5 mg via ORAL
  Filled 2020-08-21 (×13): qty 1

## 2020-08-21 MED ORDER — DEXMEDETOMIDINE HCL IN NACL 400 MCG/100ML IV SOLN
0.1000 ug/kg/h | INTRAVENOUS | Status: DC
  Filled 2020-08-21: qty 100

## 2020-08-21 MED ORDER — ASPIRIN EC 325 MG PO TBEC
325.0000 mg | DELAYED_RELEASE_TABLET | Freq: Every day | ORAL | Status: DC
  Administered 2020-08-22 – 2020-08-26 (×5): 325 mg via ORAL
  Filled 2020-08-21 (×6): qty 1

## 2020-08-21 MED ORDER — DOCUSATE SODIUM 100 MG PO CAPS
200.0000 mg | ORAL_CAPSULE | Freq: Every day | ORAL | Status: DC
  Administered 2020-08-22 – 2020-08-27 (×5): 200 mg via ORAL
  Filled 2020-08-21 (×6): qty 2

## 2020-08-21 MED ORDER — HEMOSTATIC AGENTS (NO CHARGE) OPTIME
TOPICAL | Status: DC | PRN
  Administered 2020-08-21 (×3): 1 via TOPICAL

## 2020-08-21 MED ORDER — BISACODYL 10 MG RE SUPP: 10.0000 mg | Freq: Every day | RECTAL | Status: DC

## 2020-08-21 MED ORDER — DEXTROSE 50 % IV SOLN
INTRAVENOUS | Status: DC | PRN
  Administered 2020-08-21: 12.5 g via INTRAVENOUS

## 2020-08-21 MED ORDER — PHENYLEPHRINE HCL-NACL 10-0.9 MG/250ML-% IV SOLN: INTRAVENOUS | Status: DC | PRN

## 2020-08-21 MED ORDER — PHENYLEPHRINE 40 MCG/ML (10ML) SYRINGE FOR IV PUSH (FOR BLOOD PRESSURE SUPPORT)
PREFILLED_SYRINGE | INTRAVENOUS | Status: AC
  Filled 2020-08-21: qty 20

## 2020-08-21 MED ORDER — ACETAMINOPHEN 160 MG/5ML PO SOLN: 650.0000 mg | Freq: Once | ORAL | Status: AC

## 2020-08-21 MED ORDER — LACTATED RINGERS IV SOLN: INTRAVENOUS | Status: DC

## 2020-08-21 MED ORDER — NOREPINEPHRINE 4 MG/250ML-% IV SOLN
0.0000 ug/min | INTRAVENOUS | Status: DC
  Administered 2020-08-21: 2 ug/min via INTRAVENOUS

## 2020-08-21 MED ORDER — ATORVASTATIN CALCIUM 80 MG PO TABS
80.0000 mg | ORAL_TABLET | Freq: Every day | ORAL | Status: DC
  Administered 2020-08-22 – 2020-08-27 (×6): 80 mg via ORAL
  Filled 2020-08-21 (×6): qty 1

## 2020-08-21 MED ORDER — 0.9 % SODIUM CHLORIDE (POUR BTL) OPTIME
TOPICAL | Status: DC | PRN
  Administered 2020-08-21: 5000 mL

## 2020-08-21 MED ORDER — ROCURONIUM BROMIDE 10 MG/ML (PF) SYRINGE
PREFILLED_SYRINGE | INTRAVENOUS | Status: DC | PRN
  Administered 2020-08-21: 50 mg via INTRAVENOUS
  Administered 2020-08-21: 100 mg via INTRAVENOUS
  Administered 2020-08-21 (×4): 50 mg via INTRAVENOUS

## 2020-08-21 MED ORDER — DEXMEDETOMIDINE HCL IN NACL 400 MCG/100ML IV SOLN: 0.0000 ug/kg/h | INTRAVENOUS | Status: DC

## 2020-08-21 MED ORDER — MIDAZOLAM HCL 2 MG/2ML IJ SOLN
2.0000 mg | INTRAMUSCULAR | Status: DC | PRN
Start: 2020-08-21 — End: 2020-08-21

## 2020-08-21 MED ORDER — BUPIVACAINE LIPOSOME 1.3 % IJ SUSP
INTRAMUSCULAR | Status: AC
  Filled 2020-08-21: qty 20

## 2020-08-21 MED ORDER — TRAMADOL HCL 50 MG PO TABS
50.0000 mg | ORAL_TABLET | ORAL | Status: DC | PRN
Start: 2020-08-21 — End: 2020-08-27
  Administered 2020-08-22 (×2): 100 mg via ORAL
  Filled 2020-08-21 (×2): qty 2

## 2020-08-21 MED ORDER — SODIUM CHLORIDE 0.45 % IV SOLN
INTRAVENOUS | Status: DC | PRN
Start: 2020-08-21 — End: 2020-08-23

## 2020-08-21 MED ORDER — PROPOFOL 10 MG/ML IV BOLUS
INTRAVENOUS | Status: AC
  Filled 2020-08-21: qty 20

## 2020-08-21 MED ORDER — LORATADINE 10 MG PO TABS
10.0000 mg | ORAL_TABLET | Freq: Every day | ORAL | Status: DC
  Administered 2020-08-22 – 2020-08-27 (×6): 10 mg via ORAL
  Filled 2020-08-21 (×6): qty 1

## 2020-08-21 MED ORDER — STERILE WATER FOR INJECTION IV SOLN
INTRAVENOUS | Status: DC | PRN
  Administered 2020-08-21: 30 mL

## 2020-08-21 MED ORDER — VANCOMYCIN HCL IN DEXTROSE 1-5 GM/200ML-% IV SOLN
1000.0000 mg | Freq: Once | INTRAVENOUS | Status: AC
  Administered 2020-08-21: 1000 mg via INTRAVENOUS
  Filled 2020-08-21: qty 200

## 2020-08-21 MED ORDER — FAMOTIDINE IN NACL 20-0.9 MG/50ML-% IV SOLN
20.0000 mg | Freq: Two times a day (BID) | INTRAVENOUS | Status: DC
  Administered 2020-08-21: 20 mg via INTRAVENOUS
  Filled 2020-08-21: qty 50

## 2020-08-21 MED ORDER — METOPROLOL TARTRATE 5 MG/5ML IV SOLN: 2.5000 mg | INTRAVENOUS | Status: DC | PRN

## 2020-08-21 MED ORDER — MIDAZOLAM HCL (PF) 5 MG/ML IJ SOLN
INTRAMUSCULAR | Status: DC | PRN
  Administered 2020-08-21: 1 mg via INTRAVENOUS
  Administered 2020-08-21 (×2): 2 mg via INTRAVENOUS
  Administered 2020-08-21: 1 mg via INTRAVENOUS
  Administered 2020-08-21: 2 mg via INTRAVENOUS

## 2020-08-21 MED ORDER — NITROGLYCERIN IN D5W 200-5 MCG/ML-% IV SOLN: 7.0000 ug/min | INTRAVENOUS | Status: DC

## 2020-08-21 MED ORDER — SODIUM CHLORIDE 0.9% FLUSH: 3.0000 mL | Freq: Two times a day (BID) | INTRAVENOUS | Status: DC

## 2020-08-21 MED ORDER — CHLORHEXIDINE GLUCONATE 0.12 % MT SOLN
15.0000 mL | OROMUCOSAL | Status: AC
  Administered 2020-08-21: 15 mL via OROMUCOSAL

## 2020-08-21 MED ORDER — ASPIRIN 81 MG PO CHEW
324.0000 mg | CHEWABLE_TABLET | Freq: Every day | ORAL | Status: DC
  Administered 2020-08-27: 324 mg
  Filled 2020-08-21: qty 4

## 2020-08-21 MED ORDER — OXYCODONE HCL 5 MG PO TABS
5.0000 mg | ORAL_TABLET | ORAL | Status: DC | PRN
  Administered 2020-08-21 – 2020-08-22 (×4): 10 mg via ORAL
  Filled 2020-08-21 (×5): qty 2

## 2020-08-21 MED ORDER — LACTATED RINGERS IV BOLUS
1000.0000 mL | Freq: Once | INTRAVENOUS | Status: AC
  Administered 2020-08-21: 1000 mL via INTRAVENOUS

## 2020-08-21 MED ORDER — CHLORHEXIDINE GLUCONATE 0.12 % MT SOLN
15.0000 mL | Freq: Once | OROMUCOSAL | Status: AC
  Administered 2020-08-21: 15 mL via OROMUCOSAL
  Filled 2020-08-21: qty 15

## 2020-08-21 MED ORDER — SERTRALINE HCL 100 MG PO TABS
100.0000 mg | ORAL_TABLET | Freq: Every day | ORAL | Status: DC
  Administered 2020-08-22 – 2020-08-27 (×6): 100 mg via ORAL
  Filled 2020-08-21 (×6): qty 1

## 2020-08-21 MED ORDER — MIDAZOLAM HCL (PF) 10 MG/2ML IJ SOLN
INTRAMUSCULAR | Status: AC
  Filled 2020-08-21: qty 2

## 2020-08-21 MED ORDER — TRAZODONE HCL 50 MG PO TABS
50.0000 mg | ORAL_TABLET | Freq: Every day | ORAL | Status: DC
  Administered 2020-08-22 – 2020-08-26 (×5): 50 mg via ORAL
  Filled 2020-08-21 (×5): qty 1

## 2020-08-21 MED ORDER — LACTATED RINGERS IV SOLN
500.0000 mL | Freq: Once | INTRAVENOUS | Status: AC | PRN
  Administered 2020-08-21: 500 mL via INTRAVENOUS

## 2020-08-21 MED ORDER — SODIUM CHLORIDE 0.9 % IV SOLN: 250.0000 mL | INTRAVENOUS | Status: DC

## 2020-08-21 MED ORDER — ARTIFICIAL TEARS OPHTHALMIC OINT
TOPICAL_OINTMENT | OPHTHALMIC | Status: DC | PRN
  Administered 2020-08-21: 1 via OPHTHALMIC

## 2020-08-21 MED ORDER — TRANEXAMIC ACID 1000 MG/10ML IV SOLN
1.5000 mg/kg/h | INTRAVENOUS | Status: DC
  Filled 2020-08-21: qty 25

## 2020-08-21 MED ORDER — DEXMEDETOMIDINE HCL IN NACL 400 MCG/100ML IV SOLN
INTRAVENOUS | Status: DC | PRN
  Administered 2020-08-21: .4 ug/kg/h via INTRAVENOUS

## 2020-08-21 MED ORDER — INSULIN REGULAR(HUMAN) IN NACL 100-0.9 UT/100ML-% IV SOLN
INTRAVENOUS | Status: DC
  Administered 2020-08-22: 1.4 [IU]/h via INTRAVENOUS
  Filled 2020-08-21: qty 100

## 2020-08-21 MED ORDER — PHENYLEPHRINE HCL-NACL 20-0.9 MG/250ML-% IV SOLN: 0.0000 ug/min | INTRAVENOUS | Status: DC

## 2020-08-21 MED ORDER — BUPIVACAINE HCL (PF) 0.5 % IJ SOLN
INTRAMUSCULAR | Status: AC
  Filled 2020-08-21: qty 30

## 2020-08-21 MED ORDER — DEXTROSE 50 % IV SOLN: 0.0000 mL | INTRAVENOUS | Status: DC | PRN

## 2020-08-21 MED ORDER — PHENYLEPHRINE 40 MCG/ML (10ML) SYRINGE FOR IV PUSH (FOR BLOOD PRESSURE SUPPORT)
PREFILLED_SYRINGE | INTRAVENOUS | Status: DC | PRN
  Administered 2020-08-21: 120 ug via INTRAVENOUS
  Administered 2020-08-21: 40 ug via INTRAVENOUS
  Administered 2020-08-21 (×3): 120 ug via INTRAVENOUS

## 2020-08-21 MED ORDER — SODIUM CHLORIDE 0.9 % IV SOLN: INTRAVENOUS | Status: DC | PRN

## 2020-08-21 MED ORDER — FENTANYL CITRATE (PF) 100 MCG/2ML IJ SOLN
INTRAMUSCULAR | Status: DC | PRN
  Administered 2020-08-21 (×5): 100 ug via INTRAVENOUS
  Administered 2020-08-21: 50 ug via INTRAVENOUS
  Administered 2020-08-21: 100 ug via INTRAVENOUS
  Administered 2020-08-21: 200 ug via INTRAVENOUS
  Administered 2020-08-21: 50 ug via INTRAVENOUS
  Administered 2020-08-21: 100 ug via INTRAVENOUS

## 2020-08-21 MED ORDER — STERILE WATER FOR INJECTION IJ SOLN
INTRAMUSCULAR | Status: AC
  Filled 2020-08-21: qty 10

## 2020-08-21 MED ORDER — METOPROLOL TARTRATE 12.5 MG HALF TABLET: 12.5000 mg | ORAL_TABLET | Freq: Once | ORAL | Status: DC

## 2020-08-21 MED ORDER — PLASMA-LYTE 148 IV SOLN
INTRAVENOUS | Status: DC | PRN
  Administered 2020-08-21: 500 mL via INTRAVASCULAR

## 2020-08-21 MED ORDER — VANCOMYCIN HCL 1000 MG IV SOLR
INTRAVENOUS | Status: DC | PRN
  Administered 2020-08-21: 3000 mg

## 2020-08-21 MED ORDER — SODIUM CHLORIDE 0.9 % IV SOLN
1.5000 g | Freq: Two times a day (BID) | INTRAVENOUS | Status: AC
  Administered 2020-08-21 – 2020-08-23 (×4): 1.5 g via INTRAVENOUS
  Filled 2020-08-21 (×2): qty 1.5

## 2020-08-21 MED ORDER — NITROGLYCERIN IN D5W 200-5 MCG/ML-% IV SOLN: 0.0000 ug/min | INTRAVENOUS | Status: DC

## 2020-08-21 MED ORDER — MORPHINE SULFATE (PF) 2 MG/ML IV SOLN
1.0000 mg | INTRAVENOUS | Status: DC | PRN
  Administered 2020-08-21: 2 mg via INTRAVENOUS
  Filled 2020-08-21: qty 1

## 2020-08-21 SURGICAL SUPPLY — 98 items
ADAPTER CARDIO PERF ANTE/RETRO (ADAPTER) ×5 IMPLANT
APPLICATOR TIP COSEAL (VASCULAR PRODUCTS) IMPLANT
APPLIER CLIP 9.375 SM OPEN (CLIP) ×5
BAG DECANTER FOR FLEXI CONT (MISCELLANEOUS) ×5 IMPLANT
BLADE CLIPPER SURG (BLADE) ×5 IMPLANT
BLADE STERNUM SYSTEM 6 (BLADE) ×5 IMPLANT
BLADE SURG 15 STRL LF DISP TIS (BLADE) ×3 IMPLANT
BLADE SURG 15 STRL SS (BLADE) ×2
BNDG ELASTIC 4X5.8 VLCR STR LF (GAUZE/BANDAGES/DRESSINGS) ×10 IMPLANT
BNDG ELASTIC 6X5.8 VLCR STR LF (GAUZE/BANDAGES/DRESSINGS) ×5 IMPLANT
BNDG GAUZE ELAST 4 BULKY (GAUZE/BANDAGES/DRESSINGS) ×10 IMPLANT
CANISTER SUCT 3000ML PPV (MISCELLANEOUS) ×5 IMPLANT
CANNULA NON VENT 22FR 12 (CANNULA) ×5 IMPLANT
CATH CPB KIT HENDRICKSON (MISCELLANEOUS) ×5 IMPLANT
CATH ROBINSON RED A/P 18FR (CATHETERS) ×10 IMPLANT
CLIP APPLIE 9.375 SM OPEN (CLIP) ×3 IMPLANT
CLIP RETRACTION 3.0MM CORONARY (MISCELLANEOUS) ×5 IMPLANT
CLIP VESOCCLUDE SM WIDE 24/CT (CLIP) ×20 IMPLANT
CONN 3/8X3/8 GISH STERILE (MISCELLANEOUS) ×5 IMPLANT
COVER MAYO STAND STRL (DRAPES) ×5 IMPLANT
DEFOGGER ANTIFOG KIT (MISCELLANEOUS) ×5 IMPLANT
DERMABOND ADVANCED (GAUZE/BANDAGES/DRESSINGS) ×4
DERMABOND ADVANCED .7 DNX12 (GAUZE/BANDAGES/DRESSINGS) ×6 IMPLANT
DRAIN CHANNEL 28F RND 3/8 FF (WOUND CARE) ×15 IMPLANT
DRAPE CARDIOVASCULAR INCISE (DRAPES) ×2
DRAPE EXTREMITY T 121X128X90 (DISPOSABLE) ×5 IMPLANT
DRAPE HALF SHEET 40X57 (DRAPES) ×5 IMPLANT
DRAPE SLUSH/WARMER DISC (DRAPES) ×5 IMPLANT
DRAPE SRG 135X102X78XABS (DRAPES) ×3 IMPLANT
DRESSING PREVENA PLUS CUSTOM (GAUZE/BANDAGES/DRESSINGS) ×3 IMPLANT
DRSG AQUACEL AG ADV 3.5X14 (GAUZE/BANDAGES/DRESSINGS) ×5 IMPLANT
DRSG PREVENA PLUS CUSTOM (GAUZE/BANDAGES/DRESSINGS) ×5
ELECT CAUTERY BLADE 6.4 (BLADE) ×5 IMPLANT
ELECT REM PT RETURN 9FT ADLT (ELECTROSURGICAL) ×10
ELECTRODE REM PT RTRN 9FT ADLT (ELECTROSURGICAL) ×6 IMPLANT
FELT TEFLON 1X6 (MISCELLANEOUS) ×10 IMPLANT
GAUZE SPONGE 4X4 12PLY STRL (GAUZE/BANDAGES/DRESSINGS) ×15 IMPLANT
GEL ULTRASOUND 20GR AQUASONIC (MISCELLANEOUS) ×5 IMPLANT
GLOVE NEODERM STRL 7.5 LF PF (GLOVE) ×9 IMPLANT
GLOVE SURG NEODERM 7.5  LF PF (GLOVE) ×6
GOWN STRL REUS W/ TWL LRG LVL3 (GOWN DISPOSABLE) ×12 IMPLANT
GOWN STRL REUS W/TWL LRG LVL3 (GOWN DISPOSABLE) ×8
HEMOSTAT POWDER SURGIFOAM 1G (HEMOSTASIS) ×10 IMPLANT
INSERT FOGARTY XLG (MISCELLANEOUS) ×5 IMPLANT
INSERT SUTURE HOLDER (MISCELLANEOUS) ×5 IMPLANT
KIT BASIN OR (CUSTOM PROCEDURE TRAY) ×5 IMPLANT
KIT SUCTION CATH 14FR (SUCTIONS) ×5 IMPLANT
KIT TURNOVER KIT B (KITS) ×5 IMPLANT
KIT VASOVIEW HEMOPRO 2 VH 4000 (KITS) ×5 IMPLANT
LINE VENT (MISCELLANEOUS) ×5 IMPLANT
NEEDLE 18GX1X1/2 (RX/OR ONLY) (NEEDLE) ×5 IMPLANT
NS IRRIG 1000ML POUR BTL (IV SOLUTION) ×25 IMPLANT
PACK E OPEN HEART (SUTURE) ×5 IMPLANT
PACK OPEN HEART (CUSTOM PROCEDURE TRAY) ×5 IMPLANT
PACK SPY-PHI (KITS) ×5 IMPLANT
PAD ARMBOARD 7.5X6 YLW CONV (MISCELLANEOUS) ×10 IMPLANT
PAD ELECT DEFIB RADIOL ZOLL (MISCELLANEOUS) ×5 IMPLANT
PENCIL BUTTON HOLSTER BLD 10FT (ELECTRODE) ×5 IMPLANT
POSITIONER HEAD DONUT 9IN (MISCELLANEOUS) ×5 IMPLANT
POWDER SURGICEL 3.0 GRAM (HEMOSTASIS) ×5 IMPLANT
PUMP SARN DELFIN (MISCELLANEOUS) ×5 IMPLANT
PUNCH AORTIC ROTATE 4.5MM 8IN (MISCELLANEOUS) ×5 IMPLANT
SEALANT SURG COSEAL 4ML (VASCULAR PRODUCTS) ×5 IMPLANT
SEALANT SURG COSEAL 8ML (VASCULAR PRODUCTS) IMPLANT
SET CARDIOPLEGIA MPS 5001102 (MISCELLANEOUS) ×5 IMPLANT
SHEARS HARMONIC 9CM CVD (BLADE) ×5 IMPLANT
SUT BONE WAX W31G (SUTURE) ×5 IMPLANT
SUT MNCRL AB 3-0 PS2 18 (SUTURE) ×10 IMPLANT
SUT MNCRL AB 4-0 PS2 18 (SUTURE) ×10 IMPLANT
SUT PDS AB 1 CTX 36 (SUTURE) ×10 IMPLANT
SUT PROLENE 3 0 SH DA (SUTURE) ×5 IMPLANT
SUT PROLENE 5 0 C 1 36 (SUTURE) IMPLANT
SUT PROLENE 6 0 C 1 30 (SUTURE) ×20 IMPLANT
SUT PROLENE 7 0 BV 1 (SUTURE) ×5 IMPLANT
SUT PROLENE 8 0 BV175 6 (SUTURE) ×10 IMPLANT
SUT PROLENE BLUE 7 0 (SUTURE) ×5 IMPLANT
SUT SILK 2 0 SH CR/8 (SUTURE) IMPLANT
SUT SILK 3 0 SH CR/8 (SUTURE) IMPLANT
SUT STEEL 6MS V (SUTURE) ×5 IMPLANT
SUT STEEL SZ 6 DBL 3X14 BALL (SUTURE) ×5 IMPLANT
SUT VIC AB 2-0 CT1 27 (SUTURE) ×4
SUT VIC AB 2-0 CT1 TAPERPNT 27 (SUTURE) ×6 IMPLANT
SUT VIC AB 2-0 CTX 27 (SUTURE) IMPLANT
SUT VIC AB 3-0 SH 27 (SUTURE) ×2
SUT VIC AB 3-0 SH 27X BRD (SUTURE) ×3 IMPLANT
SUT VIC AB 3-0 X1 27 (SUTURE) IMPLANT
SYR 10ML LL (SYRINGE) IMPLANT
SYR 30ML LL (SYRINGE) ×5 IMPLANT
SYR 3ML LL SCALE MARK (SYRINGE) ×5 IMPLANT
SYSTEM SAHARA CHEST DRAIN ATS (WOUND CARE) ×5 IMPLANT
TAPE PAPER 3X10 WHT MICROPORE (GAUZE/BANDAGES/DRESSINGS) ×5 IMPLANT
TOWEL GREEN STERILE (TOWEL DISPOSABLE) ×5 IMPLANT
TOWEL GREEN STERILE FF (TOWEL DISPOSABLE) ×5 IMPLANT
TRAY FOLEY SLVR 16FR TEMP STAT (SET/KITS/TRAYS/PACK) ×5 IMPLANT
TUBING LAP HI FLOW INSUFFLATIO (TUBING) ×5 IMPLANT
UNDERPAD 30X36 HEAVY ABSORB (UNDERPADS AND DIAPERS) ×5 IMPLANT
WATER STERILE IRR 1000ML POUR (IV SOLUTION) ×10 IMPLANT
WATER STERILE IRR 1000ML UROMA (IV SOLUTION) IMPLANT

## 2020-08-21 NOTE — Progress Notes (Signed)
Spoke with Atkins MD in regards to patient's low CI and increasing pressor requirements. Orders received for 1000 mL LR bolus, to switch neo to levo gtt, and to use cuff instead of arterial line BP. MD okay with weaning to extubate.

## 2020-08-21 NOTE — Progress Notes (Signed)
EVENING ROUNDS NOTE :     [place]       [place] [address]             [phone]                 Day of Surgery Procedure(s) (LRB): CORONARY ARTERY BYPASS GRAFTING (CABG) TIMES FOUR USING RIGHT LEG GREATER SAPHENOUS VEIN HARVESTED ENDOSCOPICALLY, OPEN HARVESTED LEFT RADIAL ARTERY, AND LEFT INTERNAL MAMMARY ARTERY. (N/A) RADIAL ARTERY HARVEST (Left) TRANSESOPHAGEAL ECHOCARDIOGRAM (TEE) (N/A) INDOCYANINE GREEN FLUORESCENCE IMAGING (ICG) (N/A)   Total Length of Stay:  LOS: 0 days  Events:   No events Extubated Minimal CT output    BP 96/72 (BP Location: Right Arm)   Pulse 89   Temp 98.2 F (36.8 C)   Resp 19   Ht 6\' 1"  (1.854 m)   Wt 110.2 kg   SpO2 99%   BMI 32.06 kg/m   PAP: (-2-52)/(-6-27) 42/19 CO:  [3.3 L/min-4 L/min] 3.4 L/min CI:  [1.4 L/min/m2-1.8 L/min/m2] 1.5 L/min/m2  Vent Mode: Stand-by FiO2 (%):  [40 %-50 %] 40 % Set Rate:  [4 bmp-12 bmp] 4 bmp Vt Set:  [630 mL] 630 mL PEEP:  [5 cmH20] 5 cmH20 Pressure Support:  [10 cmH20] 10 cmH20  . sodium chloride 20 mL/hr at 08/21/20 1700  . [START ON 08/22/2020] sodium chloride    . sodium chloride    . cefUROXime (ZINACEF)  IV 1.5 g (08/21/20 1707)  . dexmedetomidine (PRECEDEX) IV infusion Stopped (08/21/20 1612)  . famotidine (PEPCID) IV Stopped (08/21/20 1438)  . insulin 2 mL/hr at 08/21/20 1700  . lactated ringers    . lactated ringers    . lactated ringers 20 mL/hr at 08/21/20 1700  . magnesium sulfate 20 mL/hr at 08/21/20 1700  . nitroGLYCERIN 5 mcg/min (08/21/20 1700)  . nitroGLYCERIN    . norepinephrine (LEVOPHED) Adult infusion 2 mcg/min (08/21/20 1724)  . phenylephrine (NEO-SYNEPHRINE) Adult infusion 30 mcg/min (08/21/20 1700)  . vancomycin      No intake/output data recorded.   CBC Latest Ref Rng & Units 08/21/2020 08/21/2020 08/21/2020  WBC 4.0 - 10.5 K/uL - 8.4 -  Hemoglobin 13.0 - 17.0 g/dL 11.9(L) 12.3(L) 10.2(L)  Hematocrit 39.0 - 52.0 % 35.0(L) 38.5(L) 30.0(L)   Platelets 150 - 400 K/uL - 143(L) -    BMP Latest Ref Rng & Units 08/21/2020 08/21/2020 08/21/2020  Glucose 70 - 99 mg/dL - 115(H) -  BUN 8 - 23 mg/dL - 15 -  Creatinine 0.61 - 1.24 mg/dL - 0.60(L) -  Sodium 135 - 145 mmol/L 143 142 141  Potassium 3.5 - 5.1 mmol/L 4.1 4.0 4.1  Chloride 98 - 111 mmol/L - 107 -  CO2 22 - 32 mmol/L - - -  Calcium 8.9 - 10.3 mg/dL - - -    ABG    Component Value Date/Time   PHART 7.364 08/21/2020 1410   PCO2ART 40.6 08/21/2020 1410   PO2ART 134 (H) 08/21/2020 1410   HCO3 23.5 08/21/2020 1410   TCO2 25 08/21/2020 1410   ACIDBASEDEF 2.0 08/21/2020 1410   O2SAT 99.0 08/21/2020 1410       Melodie Bouillon, MD 08/21/2020 6:33 PM

## 2020-08-21 NOTE — Transfer of Care (Signed)
Immediate Anesthesia Transfer of Care Note  Patient: Brian Carpenter  Procedure(s) Performed: CORONARY ARTERY BYPASS GRAFTING (CABG) TIMES FOUR USING RIGHT LEG GREATER SAPHENOUS VEIN HARVESTED ENDOSCOPICALLY, OPEN HARVESTED LEFT RADIAL ARTERY, AND LEFT INTERNAL MAMMARY ARTERY. (N/A Chest) RADIAL ARTERY HARVEST (Left Arm Lower) TRANSESOPHAGEAL ECHOCARDIOGRAM (TEE) (N/A ) INDOCYANINE GREEN FLUORESCENCE IMAGING (ICG) (N/A )  Patient Location: SICU  Anesthesia Type:General  Level of Consciousness: Patient remains intubated per anesthesia plan  Airway & Oxygen Therapy: Patient remains intubated per anesthesia plan and Patient placed on Ventilator (see vital sign flow sheet for setting)  Post-op Assessment: Report given to RN and Post -op Vital signs reviewed and stable  Post vital signs: Reviewed and stable  Last Vitals:  Vitals Value Taken Time  BP 128/89 08/21/20 1346  Temp 35.6 C 08/21/20 1348  Pulse 80 08/21/20 1348  Resp 12 08/21/20 1347  SpO2 100 % 08/21/20 1348  Vitals shown include unvalidated device data.  Last Pain:  Vitals:   08/21/20 0623  TempSrc:   PainSc: 0-No pain         Complications: No complications documented.

## 2020-08-21 NOTE — Anesthesia Postprocedure Evaluation (Signed)
Anesthesia Post Note  Patient: Brian Carpenter  Procedure(s) Performed: CORONARY ARTERY BYPASS GRAFTING (CABG) TIMES FOUR USING RIGHT LEG GREATER SAPHENOUS VEIN HARVESTED ENDOSCOPICALLY, OPEN HARVESTED LEFT RADIAL ARTERY, AND LEFT INTERNAL MAMMARY ARTERY. (N/A Chest) RADIAL ARTERY HARVEST (Left Arm Lower) TRANSESOPHAGEAL ECHOCARDIOGRAM (TEE) (N/A ) INDOCYANINE GREEN FLUORESCENCE IMAGING (ICG) (N/A )     Patient location during evaluation: SICU Anesthesia Type: General Level of consciousness: sedated Pain management: pain level controlled Vital Signs Assessment: post-procedure vital signs reviewed and stable Respiratory status: patient remains intubated per anesthesia plan Cardiovascular status: stable Postop Assessment: no apparent nausea or vomiting Anesthetic complications: no   No complications documented.  Last Vitals:  Vitals:   08/21/20 2130 08/21/20 2145  BP: 118/79 125/88  Pulse: 88 87  Resp: (!) 22 (!) 22  Temp: 37.4 C 37.5 C  SpO2: 99% 98%    Last Pain:  Vitals:   08/21/20 2023  TempSrc:   PainSc: 7                  Catalina Gravel

## 2020-08-21 NOTE — Anesthesia Procedure Notes (Signed)
Central Venous Catheter Insertion Performed by: Suzette Battiest, MD, anesthesiologist Start/End3/22/2022 6:50 AM, 08/21/2020 7:05 AM Patient location: Pre-op. Preanesthetic checklist: patient identified, IV checked, site marked, risks and benefits discussed, surgical consent, monitors and equipment checked, pre-op evaluation, timeout performed and anesthesia consent Hand hygiene performed  and maximum sterile barriers used  PA cath was placed.Swan type:thermodilution PA Cath depth:55 Procedure performed without using ultrasound guided technique. Attempts: 1 Patient tolerated the procedure well with no immediate complications.

## 2020-08-21 NOTE — Anesthesia Procedure Notes (Signed)
Procedure Name: Intubation Date/Time: 08/21/2020 7:52 AM Performed by: Thelma Comp, CRNA Pre-anesthesia Checklist: Patient identified, Emergency Drugs available, Suction available and Patient being monitored Patient Re-evaluated:Patient Re-evaluated prior to induction Oxygen Delivery Method: Circle System Utilized Preoxygenation: Pre-oxygenation with 100% oxygen Induction Type: IV induction Ventilation: Mask ventilation without difficulty Laryngoscope Size: Mac and 4 Grade View: Grade I Tube type: Oral Tube size: 8.0 mm Number of attempts: 1 Airway Equipment and Method: Stylet Placement Confirmation: ETT inserted through vocal cords under direct vision,  positive ETCO2 and breath sounds checked- equal and bilateral Secured at: 25 cm Tube secured with: Tape Dental Injury: Teeth and Oropharynx as per pre-operative assessment

## 2020-08-21 NOTE — Procedures (Signed)
Extubation Procedure Note  Patient Details:   Name: Brian Carpenter DOB: 09/04/1950 MRN: [id number]   Airway Documentation:    Vent end date: 08/21/20 Vent end time: 1826   Evaluation  O2 sats: stable throughout Complications: No apparent complications Patient did tolerate procedure well. Bilateral Breath Sounds: Clear,Diminished   Yes   NIF -20, VC > 8mL/kg  No stridor noted  Kazumi Lachney 08/21/2020, 6:30 PM

## 2020-08-21 NOTE — H&P (Signed)
History and Physical Interval Note:  08/21/2020 7:14 AM  Brian Carpenter  has presented today for surgery, with the diagnosis of CAD.  The various methods of treatment have been discussed with the patient and family. After consideration of risks, benefits and other options for treatment, the patient has consented to  Procedure(s): CORONARY ARTERY BYPASS GRAFTING (CABG) (N/A) possible RADIAL ARTERY HARVEST (Left) TRANSESOPHAGEAL ECHOCARDIOGRAM (TEE) (N/A) INDOCYANINE GREEN FLUORESCENCE IMAGING (ICG) (N/A) as a surgical intervention.  The patient's history has been reviewed, patient examined, no change in status, stable for surgery.  I have reviewed the patient's chart and labs.  Questions were answered to the patient's satisfaction.     Wonda Olds

## 2020-08-21 NOTE — Progress Notes (Signed)
  Echocardiogram Echocardiogram Transesophageal has been performed.  Darlina Sicilian M 08/21/2020, 8:59 AM

## 2020-08-21 NOTE — Anesthesia Procedure Notes (Signed)
Central Venous Catheter Insertion Performed by: Suzette Battiest, MD, anesthesiologist Start/End3/22/2022 6:50 AM, 08/21/2020 7:05 AM Patient location: Pre-op. Preanesthetic checklist: patient identified, IV checked, site marked, risks and benefits discussed, surgical consent, monitors and equipment checked, pre-op evaluation, timeout performed and anesthesia consent Position: Trendelenburg Lidocaine 1% used for infiltration and patient sedated Hand hygiene performed , maximum sterile barriers used  and Seldinger technique used Catheter size: 9 Fr Total catheter length 10. Central line and PA cath was placed.MAC introducer Swan type:thermodilution PA Cath depth:55 Procedure performed using ultrasound guided technique. Ultrasound Notes:anatomy identified, needle tip was noted to be adjacent to the nerve/plexus identified, no ultrasound evidence of intravascular and/or intraneural injection and image(s) printed for medical record Attempts: 1 Following insertion, line sutured and dressing applied. Post procedure assessment: blood return through all ports, free fluid flow and no air  Patient tolerated the procedure well with no immediate complications.

## 2020-08-21 NOTE — Brief Op Note (Signed)
08/21/2020  12:07 PM  PATIENT:  Brian Carpenter  70 y.o. male  PRE-OPERATIVE DIAGNOSIS:  CAD  POST-OPERATIVE DIAGNOSIS:  CAD  PROCEDURE:  Procedure(s):  CORONARY ARTERY BYPASS GRAFTING x 4 -LIMA to LAD -SVG to OM1 -Radial artery to Ramus Intermediate -SVG to PDA  OPEN RADIAL ARTERY HARVEST  -25  ENDOSCOPIC HARVEST GREATER SAPHENOUS VEIN -Right Thigh (15 min/10)  TRANSESOPHAGEAL ECHOCARDIOGRAM (TEE) (N/A)  INDOCYANINE GREEN FLUORESCENCE IMAGING (ICG) (N/A)  SURGEON:  Surgeon(s) and Role:    Wonda Olds, MD - Primary  PHYSICIAN ASSISTANT: Ellwood Handler PA-C  ASSISTANTS: Ara Kussmaul RNFA   ANESTHESIA:   general  EBL:  Per anes record   BLOOD ADMINISTERED: CELLSAVER  DRAINS: Left Pleural Chest Tubes, Mediastinal Chest Drains   LOCAL MEDICATIONS USED:  NONE  SPECIMEN:  No Specimen  DISPOSITION OF SPECIMEN:  N/A  COUNTS:  YES  TOURNIQUET:  * No tourniquets in log *  DICTATION: .Dragon Dictation  PLAN OF CARE: Admit to inpatient   PATIENT DISPOSITION:  ICU - intubated and hemodynamically stable.   Delay start of Pharmacological VTE agent (>24hrs) due to surgical blood loss or risk of bleeding: yes

## 2020-08-21 NOTE — Hospital Course (Addendum)
History of Present Illness:  Mr. Marxen is a 70 yo male with known history of HTN, Hyperlipidemia, Type 2 DM, H/O gastric bypass, and obesity.  The patient had been experiencing exertional chest pain over the past several months.  He had a myocardial perfusion test which was abnormal.  He underwent cardiac catheterization by Dr. Sabra Heck in [place].  This showed a mildly reduced EF of 45% and multivessel CAD.  It was felt he may benefit from coronary bypass grafting procedure and he was transferred to [facility] for further care.  Upon arrival the patient was noted to be COVID positive.  He is asymptomatic and has been vaccinated.  The patient is a Restaurant manager, fast food.  He states that he thinks his pain likely developed back in July.  He states he was doing some work when he developed some chest discomfort.  He initially attributed this to a pulled muscle across his chest.  He states he experienced shortness of breath, decreased energy level, and fatigue as well at that time.  He states more recently the pain has become more severe.  He states his mainly experiences symptoms with exertion, however back in July symptoms would also occur at rest.  The patient denies ever smoking.  He is a diabetic treated with insulin.  He remains physically active and prior to July had no issues getting done what he needed to.  The patient has had multiple orthopedic surgeries.  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  Currently he is pain free on Heparin drip.  He was evaluated by Dr. Orvan Seen who felt he would be a candidate for coronary bypass grafting.  However with current COVID infection it was felt he should wait 10 days prior to proceeding with surgery.  The risks and benefits of the procedure were explained to the patient and he was agreeable to proceed.  He was agreeable to utilize the cell saver during the procedure, however he did refuse transfusion of blood products.   He was discharged home prior to his surgery.  Hospital Course:  Mr. Jaquith presented to [facility] on 08/21/2020.  He was taken to the operating room and underwent CABG x 4 utilizing LIMA to LAD, Radial artery to Ramus Intermediate,  SVG to PDA, and SVG to OM.  He also underwent open harvest of his left radial artery and endoscopic harvest of the greater saphenous vein from his right thigh.  He tolerated the procedure without difficulty and was taken to the SICU in stable condition.  The patient was extubated the evening of surgery without difficulty.  He was weaned off Neo-synephrine drips and Levophed as hemodynamics allowed.   His post operative H/H was 12.7/37.4.  He was started on Isordil for his radial artery graft.  His chest tubes and arterial lines were removed without difficulty.  He remains in NSR.  He was felt medically stable for transfer to the progressive care unit on 08/24/2020.  The patient continued to make good progress.  He remains in NSR.  His pacing wires were removed without difficulty.  His hemoglobin level remains WNL at 10.5.  The patient converted to rate controlled Atrial Fibrillation.  He was treated with IV Amiodarone and bolus to attempt and achieve conversion to NSR.  His CHADsVASC score is 3 and unfortunately he continued to have brief episodes of PAF.  It was felt we should start the patient on Eliquis for this.  Hopefully this can be discontinued in a few weeks.  He is a poorly controlled diabetic.  Diabetes education was provided to the patient and he will require close follow up post operatively to ensure longevity of his arterial grafts.  He is ambulating without significant difficulty.  A rolling walker was arranged for home use.  His surgical incisions are healing without evidence of infection.  He denies paraesthesias in his left radial artery site.  He is felt medically stable for discharge home today.

## 2020-08-21 NOTE — Progress Notes (Signed)
    Notified MD about low indexs at 1.4 and the increasing need for pressors. Was given an order to switch from neo to levo and to give a 1L LR bolus. Indexs have slightly improved at 1.8  Arterial line is positional and reading low. MD aware and agrees to titrate off the cuff pressure.   Pt extubated at 1825. A&O x4. Pt resting.

## 2020-08-21 NOTE — Anesthesia Procedure Notes (Signed)
Arterial Line Insertion Start/End3/22/2022 7:10 AM, 08/21/2020 7:17 AM Performed by: Suzette Battiest, MD, anesthesiologist  Patient location: Pre-op. Preanesthetic checklist: patient identified, IV checked, site marked, risks and benefits discussed, surgical consent, monitors and equipment checked, pre-op evaluation, timeout performed and anesthesia consent Lidocaine 1% used for infiltration Right, radial was placed Catheter size: 20 Fr Hand hygiene performed  and maximum sterile barriers used   Attempts: 1 Procedure performed using ultrasound guided technique. Ultrasound Notes:anatomy identified, needle tip was noted to be adjacent to the nerve/plexus identified, no ultrasound evidence of intravascular and/or intraneural injection and image(s) printed for medical record Following insertion, dressing applied and Biopatch. Post procedure assessment: normal and unchanged

## 2020-08-22 ENCOUNTER — Inpatient Hospital Stay (HOSPITAL_COMMUNITY): Payer: Medicare PPO

## 2020-08-22 ENCOUNTER — Encounter (HOSPITAL_COMMUNITY): Payer: Self-pay | Admitting: Cardiothoracic Surgery

## 2020-08-22 LAB — POCT I-STAT 7, (LYTES, BLD GAS, ICA,H+H)
Acid-base deficit: 4 mmol/L — ABNORMAL HIGH (ref 0.0–2.0)
Acid-base deficit: 5 mmol/L — ABNORMAL HIGH (ref 0.0–2.0)
Bicarbonate: 20.7 mmol/L (ref 20.0–28.0)
Bicarbonate: 20.2 mmol/L (ref 20.0–28.0)
Calcium, Ion: 1.3 mmol/L (ref 1.15–1.40)
Calcium, Ion: 1.31 mmol/L (ref 1.15–1.40)
HCT: 36 % — ABNORMAL LOW (ref 39.0–52.0)
HCT: 37 % — ABNORMAL LOW (ref 39.0–52.0)
Hemoglobin: 12.2 g/dL — ABNORMAL LOW (ref 13.0–17.0)
Hemoglobin: 12.6 g/dL — ABNORMAL LOW (ref 13.0–17.0)
O2 Saturation: 98 %
O2 Saturation: 98 %
Patient temperature: 36.9
Patient temperature: 37
Potassium: 4.4 mmol/L (ref 3.5–5.1)
Potassium: 4.4 mmol/L (ref 3.5–5.1)
Sodium: 142 mmol/L (ref 135–145)
Sodium: 142 mmol/L (ref 135–145)
TCO2: 22 mmol/L (ref 22–32)
TCO2: 21 mmol/L — ABNORMAL LOW (ref 22–32)
pCO2 arterial: 35.3 mmHg (ref 32.0–48.0)
pCO2 arterial: 36 mmHg (ref 32.0–48.0)
pH, Arterial: 7.376 (ref 7.350–7.450)
pH, Arterial: 7.356 (ref 7.350–7.450)
pO2, Arterial: 106 mmHg (ref 83.0–108.0)
pO2, Arterial: 101 mmHg (ref 83.0–108.0)

## 2020-08-22 LAB — CBC
HCT: 37.4 % — ABNORMAL LOW (ref 39.0–52.0)
HCT: 38.2 % — ABNORMAL LOW (ref 39.0–52.0)
Hemoglobin: 12.7 g/dL — ABNORMAL LOW (ref 13.0–17.0)
Hemoglobin: 12.4 g/dL — ABNORMAL LOW (ref 13.0–17.0)
MCH: 30.3 pg (ref 26.0–34.0)
MCH: 29.9 pg (ref 26.0–34.0)
MCHC: 34 g/dL (ref 30.0–36.0)
MCHC: 32.5 g/dL (ref 30.0–36.0)
MCV: 89.3 fL (ref 80.0–100.0)
MCV: 92 fL (ref 80.0–100.0)
Platelets: 180 10*3/uL (ref 150–400)
Platelets: 181 10*3/uL (ref 150–400)
RBC: 4.19 MIL/uL — ABNORMAL LOW (ref 4.22–5.81)
RBC: 4.15 MIL/uL — ABNORMAL LOW (ref 4.22–5.81)
RDW: 15.2 % (ref 11.5–15.5)
RDW: 15.5 % (ref 11.5–15.5)
WBC: 15 10*3/uL — ABNORMAL HIGH (ref 4.0–10.5)
WBC: 15.9 10*3/uL — ABNORMAL HIGH (ref 4.0–10.5)
nRBC: 0 % (ref 0.0–0.2)
nRBC: 0 % (ref 0.0–0.2)

## 2020-08-22 LAB — BASIC METABOLIC PANEL
Anion gap: 7 (ref 5–15)
Anion gap: 5 (ref 5–15)
BUN: 11 mg/dL (ref 8–23)
BUN: 14 mg/dL (ref 8–23)
CO2: 20 mmol/L — ABNORMAL LOW (ref 22–32)
CO2: 23 mmol/L (ref 22–32)
Calcium: 8.6 mg/dL — ABNORMAL LOW (ref 8.9–10.3)
Calcium: 8.9 mg/dL (ref 8.9–10.3)
Chloride: 110 mmol/L (ref 98–111)
Chloride: 107 mmol/L (ref 98–111)
Creatinine, Ser: 0.87 mg/dL (ref 0.61–1.24)
Creatinine, Ser: 1.1 mg/dL (ref 0.61–1.24)
GFR, Estimated: >60 mL/min (ref >60)
GFR, Estimated: >60 mL/min (ref >60)
Glucose, Bld: 178 mg/dL — ABNORMAL HIGH (ref 70–99)
Glucose, Bld: 164 mg/dL — ABNORMAL HIGH (ref 70–99)
Potassium: 4.1 mmol/L (ref 3.5–5.1)
Potassium: 4.2 mmol/L (ref 3.5–5.1)
Sodium: 137 mmol/L (ref 135–145)
Sodium: 135 mmol/L (ref 135–145)

## 2020-08-22 LAB — GLUCOSE, CAPILLARY
Glucose-Capillary: 109 mg/dL — ABNORMAL HIGH (ref 70–99)
Glucose-Capillary: 120 mg/dL — ABNORMAL HIGH (ref 70–99)
Glucose-Capillary: 126 mg/dL — ABNORMAL HIGH (ref 70–99)
Glucose-Capillary: 133 mg/dL — ABNORMAL HIGH (ref 70–99)
Glucose-Capillary: 137 mg/dL — ABNORMAL HIGH (ref 70–99)
Glucose-Capillary: 138 mg/dL — ABNORMAL HIGH (ref 70–99)
Glucose-Capillary: 141 mg/dL — ABNORMAL HIGH (ref 70–99)
Glucose-Capillary: 143 mg/dL — ABNORMAL HIGH (ref 70–99)
Glucose-Capillary: 153 mg/dL — ABNORMAL HIGH (ref 70–99)
Glucose-Capillary: 161 mg/dL — ABNORMAL HIGH (ref 70–99)
Glucose-Capillary: 164 mg/dL — ABNORMAL HIGH (ref 70–99)
Glucose-Capillary: 180 mg/dL — ABNORMAL HIGH (ref 70–99)
Glucose-Capillary: 181 mg/dL — ABNORMAL HIGH (ref 70–99)
Glucose-Capillary: 183 mg/dL — ABNORMAL HIGH (ref 70–99)
Glucose-Capillary: 187 mg/dL — ABNORMAL HIGH (ref 70–99)
Glucose-Capillary: 218 mg/dL — ABNORMAL HIGH (ref 70–99)
Glucose-Capillary: 241 mg/dL — ABNORMAL HIGH (ref 70–99)
Glucose-Capillary: 252 mg/dL — ABNORMAL HIGH (ref 70–99)

## 2020-08-22 LAB — MAGNESIUM
Magnesium: 1.9 mg/dL (ref 1.7–2.4)
Magnesium: 2.1 mg/dL (ref 1.7–2.4)

## 2020-08-22 MED ORDER — LEVALBUTEROL TARTRATE 45 MCG/ACT IN AERO
2.0000 | INHALATION_SPRAY | Freq: Four times a day (QID) | RESPIRATORY_TRACT | Status: DC
  Administered 2020-08-23 (×3): 2 via RESPIRATORY_TRACT
  Filled 2020-08-22: qty 15

## 2020-08-22 MED ORDER — COLCHICINE 0.3 MG HALF TABLET
0.3000 mg | ORAL_TABLET | Freq: Two times a day (BID) | ORAL | Status: DC
  Administered 2020-08-22 (×2): 0.3 mg via ORAL
  Filled 2020-08-22 (×3): qty 1

## 2020-08-22 MED ORDER — ORAL CARE MOUTH RINSE
15.0000 mL | Freq: Two times a day (BID) | OROMUCOSAL | Status: DC
  Administered 2020-08-22 – 2020-08-27 (×5): 15 mL via OROMUCOSAL

## 2020-08-22 MED ORDER — THIAMINE HCL 100 MG/ML IJ SOLN
Freq: Once | INTRAVENOUS | Status: AC
  Filled 2020-08-22: qty 1000

## 2020-08-22 MED ORDER — ISOSORBIDE DINITRATE 10 MG PO TABS
10.0000 mg | ORAL_TABLET | Freq: Three times a day (TID) | ORAL | Status: DC
  Administered 2020-08-22 – 2020-08-27 (×16): 10 mg via ORAL
  Filled 2020-08-22 (×16): qty 1

## 2020-08-22 MED FILL — Calcium Chloride Inj 10%: INTRAVENOUS | Qty: 10 | Status: AC

## 2020-08-22 MED FILL — Heparin Sodium (Porcine) Inj 1000 Unit/ML: INTRAMUSCULAR | Qty: 20 | Status: AC

## 2020-08-22 MED FILL — Sodium Chloride IV Soln 0.9%: INTRAVENOUS | Qty: 2000 | Status: AC

## 2020-08-22 MED FILL — Mannitol IV Soln 20%: INTRAVENOUS | Qty: 500 | Status: AC

## 2020-08-22 MED FILL — Magnesium Sulfate Inj 50%: INTRAMUSCULAR | Qty: 20 | Status: AC

## 2020-08-22 MED FILL — Heparin Sodium (Porcine) Inj 1000 Unit/ML: INTRAMUSCULAR | Qty: 30 | Status: AC

## 2020-08-22 MED FILL — Electrolyte-R (PH 7.4) Solution: INTRAVENOUS | Qty: 5000 | Status: AC

## 2020-08-22 MED FILL — Sodium Bicarbonate IV Soln 8.4%: INTRAVENOUS | Qty: 50 | Status: AC

## 2020-08-22 MED FILL — Potassium Chloride Inj 2 mEq/ML: INTRAVENOUS | Qty: 40 | Status: AC

## 2020-08-22 NOTE — Op Note (Signed)
CARDIOTHORACIC SURGERY OPERATIVE NOTE  Date of Procedure: 08/21/20 Preoperative Diagnosis: Severe 3-vessel Coronary Artery Disease  Postoperative Diagnosis: Same  Procedure:    Coronary Artery Bypass Grafting x 4  Left Internal Mammary Artery to Distal Left Anterior Descending Coronary Artery; Saphenous Vein Graft to Posterior Descending Coronary Artery; Saphenous Vein Graft to  Obtuse Marginal Branch of Left Circumflex Coronary Artery; left radial artery Graft to ramus intermedius coronary Artery; Endoscopic Vein Harvest from right Thigh  Open left radial artery harvesting Completion graft surveillance with indocyanine green fluorescence imaging (SPY)  Surgeon: B. Murvin Natal, MD  Assistant: Leretha Pol, PA-C  Anesthesia: get  Operative Findings:  Mildly reduced left ventricular systolic function  Good quality left internal mammary artery conduit  good quality saphenous vein conduit and radial artery conduit  good quality target vessels for grafting, small diameter    BRIEF CLINICAL NOTE AND INDICATIONS FOR SURGERY  70 yo man presented a couple of weeks ago with progressive angina and underwent stress test in hometown of [place], [place]. This was +, and he underwent LHC, showing multivessel CAD. He was referred as an inpatient to [facility] for CABG, but upon admission was found to be COVID +. He has waited the requisite 10 days of quarantining and now presents for surgery. He has been relatively stable clinically but is limited in activity due to CP with exertion.    DETAILS OF THE OPERATIVE PROCEDURE  Preparation:  The patient is brought to the operating room on the above mentioned date and central monitoring was established by the anesthesia team including placement of Swan-Ganz catheter and radial arterial line. The patient is placed in the supine position on the operating table.  Intravenous antibiotics are administered. General endotracheal anesthesia is induced uneventfully. A  Foley catheter is placed.  Baseline transesophageal echocardiogram was performed.  Findings were notable for mildly reduced LV function and no significant valvular disease.   The patient's chest, abdomen, both groins, the left upper extremity, and both lower extremities are prepared and draped in a sterile manner. A time out procedure is performed.   Surgical Approach and Conduit Harvest:  Attention is 1st turned to left radial artery harvesting which is done in an open fashion. The radial artery is of good quality. After closing the forearm incision, the left arm is tucked at the side. A median sternotomy incision was performed and the left internal mammary artery is dissected from the chest wall and prepared for bypass grafting. The left internal mammary artery is notably good quality conduit. Simultaneously, the greater saphenous vein is obtained from the patient's right thigh using endoscopic vein harvest technique. The saphenous vein is notably good quality conduit. After removal of the saphenous vein, the small surgical incisions in the lower extremity are closed with absorbable suture. Following systemic heparinization, the left internal mammary artery was transected distally noted to have excellent flow.   Extracorporeal Cardiopulmonary Bypass and Myocardial Protection:  The pericardium is opened. The ascending aorta is nondiseased in appearance. The ascending aorta and the right atrium are cannulated for cardiopulmonary bypass.  Adequate heparinization is verified.   The entire pre-bypass portion of the operation was notable for stable hemodynamics.  Cardiopulmonary bypass was begun and the surface of the heart is inspected. Distal target vessels are selected for coronary artery bypass grafting. A cardioplegia cannula is placed in the ascending aorta.  The patient is allowed to cool passively to 34C systemic temperature.  The aortic cross clamp is applied and cold blood cardioplegia  is  delivered initially in an antegrade fashion through the aortic root.   Iced saline slush is applied for topical hypothermia.  The initial cardioplegic arrest is rapid with early diastolic arrest.  Repeat doses of cardioplegia are administered intermittently throughout the entire cross clamp portion of the operation in order to maintain completely flat electrocardiogram.   Coronary Artery Bypass Grafting:   The posterior descending branch of the right coronary artery was grafted using a reversed saphenous vein graft in an end-to-side fashion.  At the site of distal anastomosis the target vessel was good quality and measured approximately 1.2 mm in diameter.  The  obtuse marginal branch of the left circumflex coronary artery was grafted using a reversed saphenous vein graft in an end-to-side fashion.  At the site of distal anastomosis the target vessel was good quality and measured approximately 1.2 mm in diameter.  The ramus intermedius coronary artery was grafted using the left radial artery  graft in an end-to-side fashion.  At the site of distal anastomosis the target vessel was good quality and measured approximately 1.5 mm in diameter.    The distal left anterior coronary artery was grafted with the left internal mammary artery in an end-to-side fashion.  At the site of distal anastomosis the target vessel was good quality and measured approximately 1.5 mm in diameter. Anastomotic patency and runoff was confirmed with indocyanine green fluorescence imaging (SPY).  All proximal graft anastomoses were placed directly to the ascending aorta prior to removal of the aortic cross clamp. A reanimation dose of cardioplegia was delivered, followed by deairing procedures, at which time the aortic cross clamp was removed.    Procedure Completion:  All proximal and distal coronary anastomoses were inspected for hemostasis and appropriate graft orientation. Epicardial pacing wires are fixed to the right  ventricular outflow tract and to the right atrial appendage. The patient is rewarmed to 37C temperature. The patient is weaned and disconnected from cardiopulmonary bypass.  The patient's rhythm at separation from bypass was sinus bradycardia.  The patient was weaned from cardiopulmonary bypass without any inotropic support.   Followup transesophageal echocardiogram performed after separation from bypass revealed  no changes from the preoperative exam.  The aortic and venous cannula were removed uneventfully. Protamine was administered to reverse the anticoagulation. The mediastinum and pleural space were inspected for hemostasis and irrigated with saline solution. The mediastinum and left pleural space were drained using fluted chest tubes placed through separate stab incisions inferiorly.  The soft tissues anterior to the aorta were reapproximated loosely. The sternum is closed with double strength sternal wire. The soft tissues anterior to the sternum were closed in multiple layers and the skin is closed with a running subcuticular skin closure.  The post-bypass portion of the operation was notable for stable rhythm and hemodynamics.  No blood products were administered during the operation.   Disposition:  The patient tolerated the procedure well and is transported to the surgical intensive care in stable condition. There are no intraoperative complications. All sponge instrument and needle counts are verified correct at completion of the operation.    Jayme Cloud, MD 08/22/2020 2:42 PM

## 2020-08-22 NOTE — Plan of Care (Signed)

## 2020-08-22 NOTE — Progress Notes (Signed)
[facility] BRIEF SICU PROGRESS NOTE  1 Day Post-Op  S/P Procedure(s) (LRB): CORONARY ARTERY BYPASS GRAFTING (CABG) TIMES FOUR USING RIGHT LEG GREATER SAPHENOUS VEIN HARVESTED ENDOSCOPICALLY, OPEN HARVESTED LEFT RADIAL ARTERY, AND LEFT INTERNAL MAMMARY ARTERY. (N/A) RADIAL ARTERY HARVEST (Left) TRANSESOPHAGEAL ECHOCARDIOGRAM (TEE) (N/A) INDOCYANINE GREEN FLUORESCENCE IMAGING (ICG) (N/A)   Stable day NSR w/ stable BP Breathing comfortably on 2 L/min UOP adequate  Plan: Continue current plan  Rexene Alberts, MD 08/22/2020 4:23 PM

## 2020-08-22 NOTE — Progress Notes (Signed)
1 Day Post-Op Procedure(s) (LRB): CORONARY ARTERY BYPASS GRAFTING (CABG) TIMES FOUR USING RIGHT LEG GREATER SAPHENOUS VEIN HARVESTED ENDOSCOPICALLY, OPEN HARVESTED LEFT RADIAL ARTERY, AND LEFT INTERNAL MAMMARY ARTERY. (N/A) RADIAL ARTERY HARVEST (Left) TRANSESOPHAGEAL ECHOCARDIOGRAM (TEE) (N/A) INDOCYANINE GREEN FLUORESCENCE IMAGING (ICG) (N/A) Subjective: No complaints  Objective: Vital signs in last 24 hours: Temp:  [96.3 F (35.7 C)-99.9 F (37.7 C)] 98.24 F (36.8 C) (03/23 0729) Pulse Rate:  [72-90] 86 (03/23 0729) Cardiac Rhythm: Atrial paced (03/22 2000) Resp:  [12-26] 26 (03/23 0729) BP: (92-133)/(66-95) 107/74 (03/23 0700) SpO2:  [97 %-100 %] 99 % (03/23 0729) Arterial Line BP: (61-144)/(46-82) 81/54 (03/23 0729) FiO2 (%):  [40 %-50 %] 40 % (03/22 1719) Weight:  [113.1 kg] 113.1 kg (03/23 0615)  Hemodynamic parameters for last 24 hours: PAP: (36-55)/(14-27) 44/19 CO:  [3.3 L/min-5.3 L/min] 4.7 L/min CI:  [1.4 L/min/m2-2.3 L/min/m2] 2.1 L/min/m2  Intake/Output from previous day: 03/22 0701 - 03/23 0700 In: 4198 [I.V.:2778.1; Blood:310; IV Piggyback:1110] Out: 2900 [Urine:1970; Blood:500; Chest Tube:430] Intake/Output this shift: No intake/output data recorded.  General appearance: alert and cooperative Neurologic: intact Heart: regular rate and rhythm, S1, S2 normal, no murmur, click, rub or gallop Lungs: diminished breath sounds LLL Abdomen: soft, non-tender; bowel sounds normal; no masses,  no organomegaly Extremities: extremities normal, atraumatic, no cyanosis or edema Wound: dressed, dry  Lab Results: Recent Labs    08/21/20 1930 08/22/20 0426  WBC 14.1* 15.0*  HGB 12.9* 12.7*  HCT 39.7 37.4*  PLT 204 180   BMET:  Recent Labs    08/21/20 1930 08/22/20 0426  NA 136 137  K 4.3 4.1  CL 110 110  CO2 20* 20*  GLUCOSE 130* 178*  BUN 13 11  CREATININE 0.82 0.87  CALCIUM 8.6* 8.6*    PT/INR:  Recent Labs    08/21/20 1356  LABPROT 15.1  INR  1.2   ABG    Component Value Date/Time   PHART 7.364 08/21/2020 1410   HCO3 23.5 08/21/2020 1410   TCO2 25 08/21/2020 1410   ACIDBASEDEF 2.0 08/21/2020 1410   O2SAT 99.0 08/21/2020 1410   CBG (last 3)  Recent Labs    08/22/20 0426 08/22/20 0548 08/22/20 0653  GLUCAP 180* 109* 143*    Assessment/Plan: S/P Procedure(s) (LRB): CORONARY ARTERY BYPASS GRAFTING (CABG) TIMES FOUR USING RIGHT LEG GREATER SAPHENOUS VEIN HARVESTED ENDOSCOPICALLY, OPEN HARVESTED LEFT RADIAL ARTERY, AND LEFT INTERNAL MAMMARY ARTERY. (N/A) RADIAL ARTERY HARVEST (Left) TRANSESOPHAGEAL ECHOCARDIOGRAM (TEE) (N/A) INDOCYANINE GREEN FLUORESCENCE IMAGING (ICG) (N/A) Mobilize gentle resuscitation   LOS: 1 day    Wonda Olds 08/22/2020

## 2020-08-23 ENCOUNTER — Inpatient Hospital Stay (HOSPITAL_COMMUNITY): Payer: Medicare PPO

## 2020-08-23 LAB — BASIC METABOLIC PANEL
Anion gap: 5 (ref 5–15)
BUN: 16 mg/dL (ref 8–23)
CO2: 22 mmol/L (ref 22–32)
Calcium: 8.7 mg/dL — ABNORMAL LOW (ref 8.9–10.3)
Chloride: 107 mmol/L (ref 98–111)
Creatinine, Ser: 1.05 mg/dL (ref 0.61–1.24)
GFR, Estimated: >60 mL/min (ref >60)
Glucose, Bld: 144 mg/dL — ABNORMAL HIGH (ref 70–99)
Potassium: 4.1 mmol/L (ref 3.5–5.1)
Sodium: 134 mmol/L — ABNORMAL LOW (ref 135–145)

## 2020-08-23 LAB — GLUCOSE, CAPILLARY
Glucose-Capillary: 125 mg/dL — ABNORMAL HIGH (ref 70–99)
Glucose-Capillary: 131 mg/dL — ABNORMAL HIGH (ref 70–99)
Glucose-Capillary: 140 mg/dL — ABNORMAL HIGH (ref 70–99)
Glucose-Capillary: 141 mg/dL — ABNORMAL HIGH (ref 70–99)
Glucose-Capillary: 146 mg/dL — ABNORMAL HIGH (ref 70–99)
Glucose-Capillary: 116 mg/dL — ABNORMAL HIGH (ref 70–99)
Glucose-Capillary: 154 mg/dL — ABNORMAL HIGH (ref 70–99)
Glucose-Capillary: 159 mg/dL — ABNORMAL HIGH (ref 70–99)
Glucose-Capillary: 149 mg/dL — ABNORMAL HIGH (ref 70–99)
Glucose-Capillary: 138 mg/dL — ABNORMAL HIGH (ref 70–99)
Glucose-Capillary: 144 mg/dL — ABNORMAL HIGH (ref 70–99)
Glucose-Capillary: 163 mg/dL — ABNORMAL HIGH (ref 70–99)
Glucose-Capillary: 148 mg/dL — ABNORMAL HIGH (ref 70–99)
Glucose-Capillary: 122 mg/dL — ABNORMAL HIGH (ref 70–99)
Glucose-Capillary: 142 mg/dL — ABNORMAL HIGH (ref 70–99)
Glucose-Capillary: 180 mg/dL — ABNORMAL HIGH (ref 70–99)

## 2020-08-23 MED ORDER — INSULIN ASPART 100 UNIT/ML ~~LOC~~ SOLN
0.0000 [IU] | Freq: Three times a day (TID) | SUBCUTANEOUS | Status: DC
  Administered 2020-08-23: 4 [IU] via SUBCUTANEOUS
  Administered 2020-08-24: 8 [IU] via SUBCUTANEOUS
  Administered 2020-08-24 (×2): 4 [IU] via SUBCUTANEOUS
  Administered 2020-08-24: 8 [IU] via SUBCUTANEOUS
  Administered 2020-08-25: 4 [IU] via SUBCUTANEOUS
  Administered 2020-08-25: 12 [IU] via SUBCUTANEOUS
  Administered 2020-08-25 – 2020-08-26 (×5): 4 [IU] via SUBCUTANEOUS
  Administered 2020-08-26: 12 [IU] via SUBCUTANEOUS
  Administered 2020-08-27 (×2): 4 [IU] via SUBCUTANEOUS

## 2020-08-23 MED ORDER — SODIUM CHLORIDE 0.9% FLUSH: 3.0000 mL | INTRAVENOUS | Status: DC | PRN

## 2020-08-23 MED ORDER — SODIUM CHLORIDE 0.9 % IV SOLN: 250.0000 mL | INTRAVENOUS | Status: DC | PRN

## 2020-08-23 MED ORDER — SODIUM CHLORIDE 0.9% FLUSH
3.0000 mL | Freq: Two times a day (BID) | INTRAVENOUS | Status: DC
  Administered 2020-08-23 – 2020-08-27 (×7): 3 mL via INTRAVENOUS

## 2020-08-23 MED ORDER — ~~LOC~~ CARDIAC SURGERY, PATIENT & FAMILY EDUCATION: Freq: Once | Status: AC

## 2020-08-23 MED ORDER — LEVALBUTEROL TARTRATE 45 MCG/ACT IN AERO
2.0000 | INHALATION_SPRAY | Freq: Four times a day (QID) | RESPIRATORY_TRACT | Status: DC | PRN
  Filled 2020-08-23: qty 15

## 2020-08-23 MED ORDER — FUROSEMIDE 10 MG/ML IJ SOLN
40.0000 mg | Freq: Two times a day (BID) | INTRAMUSCULAR | Status: DC
  Administered 2020-08-23 (×2): 40 mg via INTRAVENOUS
  Filled 2020-08-23 (×2): qty 4

## 2020-08-23 MED ORDER — INSULIN GLARGINE 100 UNIT/ML ~~LOC~~ SOLN
20.0000 [IU] | Freq: Every day | SUBCUTANEOUS | Status: DC
  Administered 2020-08-24 – 2020-08-27 (×3): 20 [IU] via SUBCUTANEOUS
  Filled 2020-08-23 (×4): qty 0.2

## 2020-08-23 MED ORDER — INSULIN DETEMIR 100 UNIT/ML ~~LOC~~ SOLN
10.0000 [IU] | Freq: Every day | SUBCUTANEOUS | Status: DC
Start: 2020-08-24 — End: 2020-08-23

## 2020-08-23 NOTE — Progress Notes (Signed)
Pt arrived from unit from 2heart. VSS.Will continue to monitor. Pt. Oriented to unit. Pacing wires taped . Chg given   Phoebe Sharps, RN

## 2020-08-23 NOTE — Progress Notes (Signed)
      [address]       [place] [address]             [phone]       Orders placed for foley removal and transition from Insulin gtt to SSI and lantus 20 units daily starting at 1000 tomorrow.    Nicholes Rough, PA-C

## 2020-08-23 NOTE — Progress Notes (Signed)
2 Days Post-Op Procedure(s) (LRB): CORONARY ARTERY BYPASS GRAFTING (CABG) TIMES FOUR USING RIGHT LEG GREATER SAPHENOUS VEIN HARVESTED ENDOSCOPICALLY, OPEN HARVESTED LEFT RADIAL ARTERY, AND LEFT INTERNAL MAMMARY ARTERY. (N/A) RADIAL ARTERY HARVEST (Left) TRANSESOPHAGEAL ECHOCARDIOGRAM (TEE) (N/A) INDOCYANINE GREEN FLUORESCENCE IMAGING (ICG) (N/A) Subjective: No complaints  Objective: Vital signs in last 24 hours: Temp:  [97.7 F (36.5 C)-98.5 F (36.9 C)] 98.5 F (36.9 C) (03/24 0746) Pulse Rate:  [62-88] 80 (03/24 0700) Cardiac Rhythm: Normal sinus rhythm (03/24 0741) Resp:  [14-32] 18 (03/24 0700) BP: (103-142)/(62-108) 109/67 (03/24 0700) SpO2:  [94 %-100 %] 97 % (03/24 0700) Arterial Line BP: (66-68)/(59-62) 66/59 (03/23 1000) Weight:  [108.6 kg] 108.6 kg (03/24 0520)  Hemodynamic parameters for last 24 hours:    Intake/Output from previous day: 03/23 0701 - 03/24 0700 In: 1005.4 [I.V.:805.5; IV Piggyback:199.9] Out: 1185 [Urine:745; Chest Tube:440] Intake/Output this shift: No intake/output data recorded.  General appearance: alert and cooperative Neurologic: intact Heart: regular rate and rhythm, S1, S2 normal, no murmur, click, rub or gallop Lungs: clear to auscultation bilaterally Abdomen: soft, non-tender; bowel sounds normal; no masses,  no organomegaly Extremities: extremities normal, atraumatic, no cyanosis or edema Wound: dressed  Lab Results: Recent Labs    08/22/20 0426 08/22/20 1526  WBC 15.0* 15.9*  HGB 12.7* 12.4*  HCT 37.4* 38.2*  PLT 180 181   BMET:  Recent Labs    08/22/20 1526 08/23/20 0243  NA 135 134*  K 4.2 4.1  CL 107 107  CO2 23 22  GLUCOSE 164* 144*  BUN 14 16  CREATININE 1.10 1.05  CALCIUM 8.9 8.7*    PT/INR:  Recent Labs    08/21/20 1356  LABPROT 15.1  INR 1.2   ABG    Component Value Date/Time   PHART 7.356 08/21/2020 1932   HCO3 20.2 08/21/2020 1932   TCO2 21 (L) 08/21/2020 1932   ACIDBASEDEF 5.0 (H)  08/21/2020 1932   O2SAT 98.0 08/21/2020 1932   CBG (last 3)  Recent Labs    08/23/20 0351 08/23/20 0532 08/23/20 0649  GLUCAP 141* 116* 154*    Assessment/Plan: S/P Procedure(s) (LRB): CORONARY ARTERY BYPASS GRAFTING (CABG) TIMES FOUR USING RIGHT LEG GREATER SAPHENOUS VEIN HARVESTED ENDOSCOPICALLY, OPEN HARVESTED LEFT RADIAL ARTERY, AND LEFT INTERNAL MAMMARY ARTERY. (N/A) RADIAL ARTERY HARVEST (Left) TRANSESOPHAGEAL ECHOCARDIOGRAM (TEE) (N/A) INDOCYANINE GREEN FLUORESCENCE IMAGING (ICG) (N/A) Mobilize Diuresis Plan for transfer to step-down: see transfer orders   LOS: 2 days    Brian Carpenter 08/23/2020

## 2020-08-23 NOTE — Progress Notes (Signed)
Inpatient Diabetes Program Recommendations  AACE/ADA: New Consensus Statement on Inpatient Glycemic Control   Target Ranges:  Prepandial:   less than 140 mg/dL      Peak postprandial:   less than 180 mg/dL (1-2 hours)      Critically ill patients:  140 - 180 mg/dL   Results for Brian Carpenter, Brian Carpenter (MRN [id number]) as of 08/23/2020 13:13  Ref. Range 08/23/2020 07:48 08/23/2020 09:34 08/23/2020 10:52 08/23/2020 11:57 08/23/2020 12:57  Glucose-Capillary Latest Ref Range: 70 - 99 mg/dL 159 (H) 149 (H) 138 (H) 144 (H) 163 (H)   Review of Glycemic Control  Diabetes history:DM2 Outpatient Diabetes medications:70/30 5-20 units BID with breakfast and supper (dose based on glucose), Jardiance 10 mg daily Current orders for Inpatient glycemic control:IV insulin  Inpatient Diabetes Program Recommendations:  Insulin: Once provider is ready to transition from IV to SQ insulin, please consider ordering Lantus 28 units Q24H, CBGs AC&HS, Novolog 0-15 units AC&HS, and Novolog 4 units TID with meals for meal coverage if patient eats at least 50% of meals.  HbgA1C: A1C 12% on 3/10/22indicating an average glucose of 297 mg/dl over the past 2-3 months.  NOTE: Patient recently inpatient 08/08/20-08/15/20 and inpatient diabetes coordinator spoke with patient on 08/09/20 regarding DM control.  Patient reported taking range dose of 70/30 depending on glucose. When patient was discharged on 08/15/20, he was prescribed 70/30 20 units BID and Jardiance 10 mg daily for DM management. Per chart on current home medication list, patient is continuing to take range dose of 70/30 insulin. Anticipate patient needs to be taking set dose of 70/30 BID (not range) and follow up with PCP regarding DM control.    Thanks, Barnie Alderman, RN, MSN, CDE Diabetes Coordinator Inpatient Diabetes Program [phone] (Team Pager from 8am to 5pm)

## 2020-08-23 NOTE — Progress Notes (Signed)
CARDIAC REHAB PHASE I   PRE:  Rate/Rhythm: 81 SR    BP: sitting 118/71    SaO2: 91 RA  MODE:  Ambulation: 150 ft   POST:  Rate/Rhythm: 100 ST    BP: sitting 136/77     SaO2: 98 RA  Pt just arrived to unit. Mod assist to get out of bed. Able to shimmy to EOB with instructions. Stood with min assist and walked on RA and with RW. Fairly steady although inconsistent stepping. SaO2 maintained 91 RA. Pt fatigued with distance, eager to get to recliner.  SaO2 98 RA in recliner. Encouraged IS and another walk. [place], ACSM 08/23/2020 3:28 PM

## 2020-08-24 ENCOUNTER — Inpatient Hospital Stay (HOSPITAL_COMMUNITY): Payer: Medicare PPO

## 2020-08-24 LAB — BASIC METABOLIC PANEL
Anion gap: 7 (ref 5–15)
BUN: 18 mg/dL (ref 8–23)
CO2: 24 mmol/L (ref 22–32)
Calcium: 8.7 mg/dL — ABNORMAL LOW (ref 8.9–10.3)
Chloride: 103 mmol/L (ref 98–111)
Creatinine, Ser: 1.14 mg/dL (ref 0.61–1.24)
GFR, Estimated: >60 mL/min (ref >60)
Glucose, Bld: 186 mg/dL — ABNORMAL HIGH (ref 70–99)
Potassium: 3.8 mmol/L (ref 3.5–5.1)
Sodium: 134 mmol/L — ABNORMAL LOW (ref 135–145)

## 2020-08-24 LAB — CBC
HCT: 31.1 % — ABNORMAL LOW (ref 39.0–52.0)
Hemoglobin: 10.2 g/dL — ABNORMAL LOW (ref 13.0–17.0)
MCH: 29.7 pg (ref 26.0–34.0)
MCHC: 32.8 g/dL (ref 30.0–36.0)
MCV: 90.4 fL (ref 80.0–100.0)
Platelets: 150 10*3/uL (ref 150–400)
RBC: 3.44 MIL/uL — ABNORMAL LOW (ref 4.22–5.81)
RDW: 15.3 % (ref 11.5–15.5)
WBC: 11.8 10*3/uL — ABNORMAL HIGH (ref 4.0–10.5)
nRBC: 0 % (ref 0.0–0.2)

## 2020-08-24 LAB — GLUCOSE, CAPILLARY
Glucose-Capillary: 228 mg/dL — ABNORMAL HIGH (ref 70–99)
Glucose-Capillary: 175 mg/dL — ABNORMAL HIGH (ref 70–99)
Glucose-Capillary: 189 mg/dL — ABNORMAL HIGH (ref 70–99)
Glucose-Capillary: 210 mg/dL — ABNORMAL HIGH (ref 70–99)

## 2020-08-24 MED ORDER — FUROSEMIDE 10 MG/ML IJ SOLN
60.0000 mg | Freq: Two times a day (BID) | INTRAMUSCULAR | Status: DC
  Administered 2020-08-24 (×2): 60 mg via INTRAVENOUS
  Filled 2020-08-24 (×2): qty 6

## 2020-08-24 NOTE — Care Management Important Message (Signed)
Important Message  Patient Details  Name: Brian Carpenter MRN: [id number] Date of Birth: 1950/11/14   Medicare Important Message Given:  Yes     Shelda Altes 08/24/2020, 9:07 AM

## 2020-08-24 NOTE — Progress Notes (Signed)
Mobility Specialist: Progress Note   08/24/20 1716  Mobility  Activity Ambulated in hall  Level of Assistance Minimal assist, patient does 75% or more  Assistive Device Front wheel walker  Distance Ambulated (ft) 270 ft  Mobility Response Tolerated well  Mobility performed by Mobility specialist  Bed Position Chair  $Mobility charge 1 Mobility   Pre-Mobility: 81 HR, 99% SpO2 Post-Mobility: 102 HR, 96% SpO2  Pt audibly SOB towards end of ambulation. Pt quickly recovered after sitting in chair for 1 minute. Pt otherwise asx and has call bell in reach.   [facility] Day Mobility Specialist Mobility Specialist Phone: [phone]

## 2020-08-24 NOTE — Progress Notes (Signed)
Removed epicardial wires per order. 4 intact.  Pt tolerated procedure well.  Pt instructed to remain on bedrest for one hour.  Frequent vitals will be taken and documented. Pt resting with call bell within reach. ° °

## 2020-08-24 NOTE — Progress Notes (Signed)
DME RW order placed- referral called to [employer] for RW delivery. Received call back from [employer] and per conversation with wife, she has declined delivery of RW per [employer]. Wife told [employer] that she will be borrowing a RW for patient to use on discharge. Marland Kitchen

## 2020-08-24 NOTE — Progress Notes (Signed)
      [address]       , [address]             [phone]      3 Days Post-Op Procedure(s) (LRB): CORONARY ARTERY BYPASS GRAFTING (CABG) TIMES FOUR USING RIGHT LEG GREATER SAPHENOUS VEIN HARVESTED ENDOSCOPICALLY, OPEN HARVESTED LEFT RADIAL ARTERY, AND LEFT INTERNAL MAMMARY ARTERY. (N/A) RADIAL ARTERY HARVEST (Left) TRANSESOPHAGEAL ECHOCARDIOGRAM (TEE) (N/A) INDOCYANINE GREEN FLUORESCENCE IMAGING (ICG) (N/A)   Subjective:  No new complaints.  Feels he is doing pretty well overall.  Just woke up.  + ambulation + BM  Objective: Vital signs in last 24 hours: Temp:  [97.5 F (36.4 C)-99 F (37.2 C)] 97.5 F (36.4 C) (03/25 0728) Pulse Rate:  [77-99] 85 (03/25 0728) Cardiac Rhythm: Normal sinus rhythm (03/24 2322) Resp:  [19-27] 22 (03/25 0728) BP: (106-136)/(63-105) 129/81 (03/25 0728) SpO2:  [91 %-98 %] 98 % (03/25 0728) FiO2 (%):  [24 %] 24 % (03/24 1709) Weight:  [112.4 kg] 112.4 kg (03/25 0422)  Intake/Output from previous day: 03/24 0701 - 03/25 0700 In: 271.2 [P.O.:240; I.V.:31.2] Out: 1165 [Urine:1125; Chest Tube:40]  General appearance: alert, cooperative and no distress Heart: regular rate and rhythm Lungs: clear to auscultation bilaterally Abdomen: soft, non-tender; bowel sounds normal; no masses,  no organomegaly Extremities: edema trace Wound: clean and dry, pravena on sternotomy, no parasthesias LUE  Lab Results: Recent Labs    08/22/20 1526 08/24/20 0146  WBC 15.9* 11.8*  HGB 12.4* 10.2*  HCT 38.2* 31.1*  PLT 181 150   BMET:  Recent Labs    08/23/20 0243 08/24/20 0146  NA 134* 134*  K 4.1 3.8  CL 107 103  CO2 22 24  GLUCOSE 144* 186*  BUN 16 18  CREATININE 1.05 1.14  CALCIUM 8.7* 8.7*    PT/INR:  Recent Labs    08/21/20 1356  LABPROT 15.1  INR 1.2   ABG    Component Value Date/Time   PHART 7.356 08/21/2020 1932   HCO3 20.2 08/21/2020 1932   TCO2 21 (L) 08/21/2020 1932   ACIDBASEDEF 5.0 (H) 08/21/2020  1932   O2SAT 98.0 08/21/2020 1932   CBG (last 3)  Recent Labs    08/23/20 2013 08/23/20 2110 08/24/20 0615  GLUCAP 142* 180* 228*    Assessment/Plan: S/P Procedure(s) (LRB): CORONARY ARTERY BYPASS GRAFTING (CABG) TIMES FOUR USING RIGHT LEG GREATER SAPHENOUS VEIN HARVESTED ENDOSCOPICALLY, OPEN HARVESTED LEFT RADIAL ARTERY, AND LEFT INTERNAL MAMMARY ARTERY. (N/A) RADIAL ARTERY HARVEST (Left) TRANSESOPHAGEAL ECHOCARDIOGRAM (TEE) (N/A) INDOCYANINE GREEN FLUORESCENCE IMAGING (ICG) (N/A)  1. CV- NSR, BP stable- continue Lopressor, Isordil for radial artery graft 2. Pulm- no acute issues, CXR with left pleural effusion concern for pulmonary edema, continue IS 3. Renal- creatinine stable, weight is minimally elevated, edema on CXR- Lasix IV ordered 4. Expected post operative blood loss anemia, Hgb at 10.2, patient is a JW, still stop blood draws unless need arises 5. DM- cbgs mostly controlled yesterday, high this morning at 228, insulin regimen adjusted, continue SSIP 6. Dispo- patient stable, continue diuretics, EPW out today, if patient remains clinically stable, possible d/c this weekend   LOS: 3 days    Ellwood Handler, PA-C 08/24/2020

## 2020-08-24 NOTE — Discharge Summary (Addendum)
[address]       [place] [address]             [phone]    Physician Discharge Summary  Patient ID: Brian Carpenter MRN: [id number] DOB/AGE: 70-Jul-1952 70 y.o.  Admit date: 08/21/2020 Discharge date: 08/27/2020  Admission Diagnoses:  Patient Active Problem List   Diagnosis Date Noted  . Ischemic cardiomyopathy 08/10/2020  . COVID-19 virus infection 08/10/2020  . Acute coronary syndrome ([place]) 08/08/2020  . Type 2 diabetes mellitus with hyperglycemia, without long-term current use of insulin ([place]) 01/04/2020  . Essential hypertension, benign 01/04/2020  . Mixed hyperlipidemia 01/04/2020   Discharge Diagnoses:  Patient Active Problem List   Diagnosis Date Noted  . 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 Ischemic cardiomyopathy 08/10/2020  . COVID-19 virus infection 08/10/2020  . Acute coronary syndrome ([place]) 08/08/2020  . Type 2 diabetes mellitus with hyperglycemia, without long-term current use of insulin ([place]) 01/04/2020  . Essential hypertension, benign 01/04/2020  . Mixed hyperlipidemia 01/04/2020   Discharged Condition: good  History of Present Illness:  [name] is a 70 yo male with known history of HTN, Hyperlipidemia, Type 2 DM, H/O gastric bypass, and obesity.  The patient had been experiencing exertional chest pain over the past several months.  He had a myocardial perfusion test which was abnormal.  He underwent cardiac catheterization by Dr. Sabra Heck in [place].  This showed a mildly reduced EF of 45% and multivessel CAD.  It was felt he may benefit from coronary bypass grafting procedure and he was transferred to [facility] for further care.  Upon arrival the patient was noted to be COVID positive.  He is asymptomatic and has been vaccinated.  The patient is a Restaurant manager, fast food.  He states that he thinks his pain likely developed back in July.  He states he was doing some work when he developed some chest discomfort.  He initially attributed  this to a pulled muscle across his chest.  He states he experienced shortness of breath, decreased energy level, and fatigue as well at that time.  He states more recently the pain has become more severe.  He states his mainly experiences symptoms with exertion, however back in July symptoms would also occur at rest.  The patient denies ever smoking.  He is a diabetic treated with insulin.  He remains physically active and prior to July had no issues getting done what he needed to.  The patient has had multiple orthopedic surgeries.  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  Currently he is pain free on Heparin drip.  He was evaluated by Dr. Orvan Seen who felt he would be a candidate for coronary bypass grafting.  However with current COVID infection it was felt he should wait 10 days prior to proceeding with surgery.  The risks and benefits of the procedure were explained to the patient and he was agreeable to proceed.  He was agreeable to utilize the cell saver during the procedure, however he did refuse transfusion of blood products.  He was discharged home prior to his surgery.  Hospital Course:  [name] presented to [facility] on 08/21/2020.  He was taken to the operating room and underwent CABG x 4 utilizing LIMA to LAD, Radial artery to Ramus Intermediate,  SVG to PDA, and SVG to OM.  He also underwent open harvest of his left radial artery and  endoscopic harvest of the greater saphenous vein from his right thigh.  He tolerated the procedure without difficulty and was taken to the SICU in stable condition.  The patient was extubated the evening of surgery without difficulty.  He was weaned off Neo-synephrine drips and Levophed as hemodynamics allowed.   His post operative H/H was 12.7/37.4.  He was started on Isordil for his radial artery graft.  His chest tubes and arterial lines were removed without difficulty.  He remains in NSR.  He was felt  medically stable for transfer to the progressive care unit on 08/24/2020.  The patient continued to make good progress.  He remains in NSR.  His pacing wires were removed without difficulty.  His hemoglobin level remains WNL at 10.5.  The patient converted to rate controlled Atrial Fibrillation.  He was treated with IV Amiodarone and bolus to attempt and achieve conversion to NSR.  His CHADsVASC score is 3 and unfortunately he continued to have brief episodes of PAF.  It was felt we should start the patient on Eliquis for this.  Hopefully this can be discontinued in a few weeks.  He is a poorly controlled diabetic.  Diabetes education was provided to the patient and he will require close follow up post operatively to ensure longevity of his arterial grafts.  He is ambulating without significant difficulty.  A rolling walker was arranged for home use.  His surgical incisions are healing without evidence of infection.  He denies paraesthesias in his left radial artery site.  He is felt medically stable for discharge home today.  Treatments: surgery:    Coronary Artery Bypass Grafting x 4             Left Internal Mammary Artery to Distal Left Anterior Descending Coronary Artery; Saphenous Vein Graft to Posterior Descending Coronary Artery; Saphenous Vein Graft to  Obtuse Marginal Branch of Left Circumflex Coronary Artery; left radial artery Graft to ramus intermedius coronary Artery; Endoscopic Vein Harvest from right Thigh  Open left radial artery harvesting Completion graft surveillance with indocyanine green fluorescence imaging (SPY)  Surgeon:        B. Murvin Natal, MD  Assistant:       Leretha Pol, PA-C   Discharge Exam: Blood pressure 133/75, pulse 67, temperature 98.3 F (36.8 C), temperature source Oral, resp. rate 20, height 6\' 1"  (1.854 m), weight 110.3 kg, SpO2 100 %.  General appearance: alert, cooperative and no distress Heart: regular rate and rhythm Lungs: clear to auscultation  bilaterally Abdomen: soft, non-tender; bowel sounds normal; no masses,  no organomegaly Extremities: edema trace Wound: clean and dry  Discharge Medications:  The patient has been discharged on:   1.Beta Blocker:  Yes [ X  ]                              No   [   ]                              If No, reason:  2.Ace Inhibitor/ARB: Yes [ x  ]                                     No  [   ]  If No, reason:   3.Statin:   Yes [ X  ]                  No  [   ]                  If No, reason:  4.Ecasa:  Yes  [ X  ]                  No   [   ]                  If No, reason:    Discharge Instructions    Amb Referral to Cardiac Rehabilitation   Complete by: As directed    Diagnosis: CABG   CABG X ___: 4   After initial evaluation and assessments completed: Virtual Based Care may be provided alone or in conjunction with Phase 2 Cardiac Rehab based on patient barriers.: Yes     Allergies as of 08/27/2020   No Known Allergies     Medication List    STOP taking these medications   amLODipine 10 MG tablet Commonly known as: NORVASC   enoxaparin 100 MG/ML injection Commonly known as: LOVENOX   spironolactone 25 MG tablet Commonly known as: ALDACTONE     TAKE these medications   acetaminophen 325 MG tablet Commonly known as: TYLENOL Take 2 tablets (650 mg total) by mouth every 4 (four) hours as needed for headache or mild pain.   amiodarone 200 MG tablet Commonly known as: PACERONE Take 1 tablet (200 mg total) by mouth 2 (two) times daily.   apixaban 5 MG Tabs tablet Commonly known as: ELIQUIS Take 1 tablet (5 mg total) by mouth 2 (two) times daily.   aspirin EC 81 MG tablet Take 81 mg by mouth daily. Swallow whole.   atorvastatin 80 MG tablet Commonly known as: LIPITOR Take 1 tablet (80 mg total) by mouth daily.   b complex vitamins capsule Take 1 capsule by mouth daily.   carvedilol 3.125 MG tablet Commonly known as:  COREG Take 1 tablet (3.125 mg total) by mouth 2 (two) times daily with a meal.   cetirizine 10 MG tablet Commonly known as: ZYRTEC Take 10 mg by mouth daily.   empagliflozin 10 MG Tabs tablet Commonly known as: JARDIANCE Take 1 tablet (10 mg total) by mouth daily.   furosemide 40 MG tablet Commonly known as: LASIX Take 40 mg by mouth daily.   insulin NPH-regular Human (70-30) 100 UNIT/ML injection Inject 20 Units into the skin 2 (two) times daily. What changed:   how much to take  additional instructions   isosorbide dinitrate 10 MG tablet Commonly known as: ISORDIL Take 1 tablet (10 mg total) by mouth 3 (three) times daily. What changed: when to take this   lisinopril 40 MG tablet Commonly known as: ZESTRIL Take 40 mg by mouth daily.   MENS MULTIVITAMIN PO Take 1 tablet by mouth daily.   nitroGLYCERIN 0.4 MG SL tablet Commonly known as: NITROSTAT Place 0.4 mg under the tongue every 5 (five) minutes as needed for chest pain.   potassium chloride SA 20 MEQ tablet Commonly known as: KLOR-CON Take 1 tablet (20 mEq total) by mouth daily. Use daily on days you are taking Lasix Start taking on: August 28, 2020   RELION GLUCOSE TEST STRIPS test strip Generic drug: glucose blood Use as instructed   sertraline 100 MG tablet Commonly known as: ZOLOFT  Take 100 mg by mouth daily.   traMADol 50 MG tablet Commonly known as: ULTRAM Take 1-2 tablets (50-100 mg total) by mouth every 4 (four) hours as needed for moderate pain.   traZODone 50 MG tablet Commonly known as: DESYREL Take 50 mg by mouth at bedtime.   trolamine salicylate 10 % cream Commonly known as: ASPERCREME Apply 1 application topically as needed for muscle pain.   Vitamin D3 50 MCG (2000 UT) Tabs Take 2,000 Units by mouth daily.            Durable Medical Equipment  (From admission, onward)         Start     Ordered   08/24/20 0857  For home use only DME Walker rolling  Once       Question  Answer Comment  Walker: With 5 Inch Wheels   Patient needs a walker to treat with the following condition S/P CABG (coronary artery bypass graft)      08/24/20 0856          Follow-up Information    Wonda Olds, MD Follow up on 09/06/2020.   Specialty: Cardiothoracic Surgery Why: Appointment is at 12:00 Contact information: [place] [address] [phone]        Orpah Greek, MD. Schedule an appointment as soon as possible for a visit.   Specialty: Internal Medicine Why: Contact office and set up follow up appointment for 1-2 weeks after hopsital discharge Contact information: [address], [address] [place] [address] [phone]               Signed:  Ellwood Handler, PA-C 08/27/2020, 1:31 PM

## 2020-08-24 NOTE — Progress Notes (Signed)
Pt declined earlier today due to significant fatigue. On my return, pt just had EPW pulled. Discussed IS, sternal precautions, exercise at home, diet, and CRPII. Will refer to [facility]. Pt practiced IS, 1250 mL. He is still working on sternal precautions. To walk with mobility team after bedrest and practice sternal precautions. Pt needs RW for home.  [place], ACSM 2:36 PM 08/24/2020

## 2020-08-24 NOTE — Consult Note (Signed)
Referring Physician: B. Atkins  Brian Carpenter is an 70 y.o. male.                       Chief Complaint: S/P CABG  HPI: Post op day 3 CABG x 4, 2 SVGs  Rdial and LIMA. He denies chest painexcept one over surgical site. He has PMH of Multivessel CAD, COVID 19 infection, type 2 DM, HTN, HLD and obesity.  Past Medical History:  Diagnosis Date  . 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 COVID-19 08/08/2020   on 08/08/20  . Depression   . Diabetes mellitus, type II ([place])    type 2  . Hearing loss    wears hearing aids  . Hyperlipidemia   . Hypertension   . Myocardial infarction ([place])   . Sleep apnea    does not use cpap      Past Surgical History:  Procedure Laterality Date  . ANKLE FRACTURE SURGERY Right   . CARPAL TUNNEL RELEASE Bilateral   . CORONARY ARTERY BYPASS GRAFT N/A 08/21/2020   Procedure: CORONARY ARTERY BYPASS GRAFTING (CABG) TIMES FOUR USING RIGHT LEG GREATER SAPHENOUS VEIN HARVESTED ENDOSCOPICALLY, OPEN HARVESTED LEFT RADIAL ARTERY, AND LEFT INTERNAL MAMMARY ARTERY.;  Surgeon: Wonda Olds, MD;  Location: [facility];  Service: Open Heart Surgery;  Laterality: N/A;  . ELBOW ARTHROSCOPY Bilateral    x 2  . fibrous sarcoma     upper left groin  . GASTRIC BYPASS    . KNEE ARTHROSCOPY Left   . RADIAL ARTERY HARVEST Left 08/21/2020   Procedure: RADIAL ARTERY HARVEST;  Surgeon: Wonda Olds, MD;  Location: [facility];  Service: Open Heart Surgery;  Laterality: Left;  . TEE WITHOUT CARDIOVERSION N/A 08/21/2020   Procedure: TRANSESOPHAGEAL ECHOCARDIOGRAM (TEE);  Surgeon: Wonda Olds, MD;  Location: [facility];  Service: Open Heart Surgery;  Laterality: N/A;    Family History  Problem Relation Age of Onset  . Diabetes Mother   . Hypertension Mother   . Hyperlipidemia Mother   . Cancer Mother   . Cancer Father    Social History:  reports that he has never smoked. He has never used smokeless tobacco. He reports current alcohol use. He reports that he does not use  drugs.  Allergies: No Known Allergies  Medications Prior to Admission  Medication Sig Dispense Refill  . acetaminophen (TYLENOL) 325 MG tablet Take 2 tablets (650 mg total) by mouth every 4 (four) hours as needed for headache or mild pain.    Marland Kitchen amLODipine (NORVASC) 10 MG tablet Take 1 tablet (10 mg total) by mouth daily. 30 tablet 0  . aspirin EC 81 MG tablet Take 81 mg by mouth daily. Swallow whole.    Marland Kitchen atorvastatin (LIPITOR) 80 MG tablet Take 1 tablet (80 mg total) by mouth daily. 30 tablet 0  . b complex vitamins capsule Take 1 capsule by mouth daily.    . carvedilol (COREG) 3.125 MG tablet Take 1 tablet (3.125 mg total) by mouth 2 (two) times daily with a meal. 60 tablet 0  . cetirizine (ZYRTEC) 10 MG tablet Take 10 mg by mouth daily.    . Cholecalciferol (VITAMIN D3) 50 MCG (2000 UT) TABS Take 2,000 Units by mouth daily.    . empagliflozin (JARDIANCE) 10 MG TABS tablet Take 1 tablet (10 mg total) by mouth daily. 30 tablet 0  . enoxaparin (LOVENOX) 100 MG/ML injection Inject 1 mL (100 mg total) into the  skin every 12 (twelve) hours. 20 mL 0  . furosemide (LASIX) 40 MG tablet Take 40 mg by mouth daily.    . insulin NPH-regular Human (70-30) 100 UNIT/ML injection Inject 20 Units into the skin 2 (two) times daily. (Patient taking differently: Inject 5-20 Units into the skin 2 (two) times daily. Per sliding scale) 30 mL 1  . isosorbide dinitrate (ISORDIL) 10 MG tablet Take 10 mg by mouth 2 (two) times daily.    Marland Kitchen lisinopril (ZESTRIL) 40 MG tablet Take 40 mg by mouth daily.    . Multiple Vitamins-Minerals (MENS MULTIVITAMIN PO) Take 1 tablet by mouth daily.    . nitroGLYCERIN (NITROSTAT) 0.4 MG SL tablet Place 0.4 mg under the tongue every 5 (five) minutes as needed for chest pain.    Marland Kitchen sertraline (ZOLOFT) 100 MG tablet Take 100 mg by mouth daily.    Marland Kitchen spironolactone (ALDACTONE) 25 MG tablet Take 0.5 tablets (12.5 mg total) by mouth 2 (two) times daily. 30 tablet 0  . traZODone (DESYREL) 50  MG tablet Take 50 mg by mouth at bedtime.    . trolamine salicylate (ASPERCREME) 10 % cream Apply 1 application topically as needed for muscle pain.    Marland Kitchen glucose blood (RELION GLUCOSE TEST STRIPS) test strip Use as instructed 100 each 2    Results for orders placed or performed during the hospital encounter of 08/21/20 (from the past 48 hour(s))  Glucose, capillary     Status: Abnormal   Collection Time: 08/22/20 12:00 PM  Result Value Ref Range   Glucose-Capillary 138 (H) 70 - 99 mg/dL    Comment: Glucose reference range applies only to samples taken after fasting for at least 8 hours.  Glucose, capillary     Status: Abnormal   Collection Time: 08/22/20  1:09 PM  Result Value Ref Range   Glucose-Capillary 241 (H) 70 - 99 mg/dL    Comment: Glucose reference range applies only to samples taken after fasting for at least 8 hours.  Glucose, capillary     Status: Abnormal   Collection Time: 08/22/20  1:48 PM  Result Value Ref Range   Glucose-Capillary 183 (H) 70 - 99 mg/dL    Comment: Glucose reference range applies only to samples taken after fasting for at least 8 hours.  Glucose, capillary     Status: Abnormal   Collection Time: 08/22/20  2:51 PM  Result Value Ref Range   Glucose-Capillary 187 (H) 70 - 99 mg/dL    Comment: Glucose reference range applies only to samples taken after fasting for at least 8 hours.  Basic metabolic panel     Status: Abnormal   Collection Time: 08/22/20  3:26 PM  Result Value Ref Range   Sodium 135 135 - 145 mmol/L   Potassium 4.2 3.5 - 5.1 mmol/L   Chloride 107 98 - 111 mmol/L   CO2 23 22 - 32 mmol/L   Glucose, Bld 164 (H) 70 - 99 mg/dL    Comment: Glucose reference range applies only to samples taken after fasting for at least 8 hours.   BUN 14 8 - 23 mg/dL   Creatinine, Ser 1.10 0.61 - 1.24 mg/dL   Calcium 8.9 8.9 - 10.3 mg/dL   GFR, Estimated >60 >60 mL/min    Comment: (NOTE) Calculated using the CKD-EPI Creatinine Equation (2021)    Anion gap  5 5 - 15    Comment: Performed at [facility] [address], [place], [place] [address]  Magnesium  Status: None   Collection Time: 08/22/20  3:26 PM  Result Value Ref Range   Magnesium 2.1 1.7 - 2.4 mg/dL    Comment: Performed at [facility], [place] [address], [place], [place] [address]  CBC     Status: Abnormal   Collection Time: 08/22/20  3:26 PM  Result Value Ref Range   WBC 15.9 (H) 4.0 - 10.5 K/uL   RBC 4.15 (L) 4.22 - 5.81 MIL/uL   Hemoglobin 12.4 (L) 13.0 - 17.0 g/dL   HCT 38.2 (L) 39.0 - 52.0 %   MCV 92.0 80.0 - 100.0 fL   MCH 29.9 26.0 - 34.0 pg   MCHC 32.5 30.0 - 36.0 g/dL   RDW 15.5 11.5 - 15.5 %   Platelets 181 150 - 400 K/uL   nRBC 0.0 0.0 - 0.2 %    Comment: Performed at [facility], [place] [address] [place], [place] [address]  Glucose, capillary     Status: Abnormal   Collection Time: 08/22/20  4:05 PM  Result Value Ref Range   Glucose-Capillary 133 (H) 70 - 99 mg/dL    Comment: Glucose reference range applies only to samples taken after fasting for at least 8 hours.  Glucose, capillary     Status: Abnormal   Collection Time: 08/22/20  5:06 PM  Result Value Ref Range   Glucose-Capillary 153 (H) 70 - 99 mg/dL    Comment: Glucose reference range applies only to samples taken after fasting for at least 8 hours.  Glucose, capillary     Status: Abnormal   Collection Time: 08/22/20  6:13 PM  Result Value Ref Range   Glucose-Capillary 141 (H) 70 - 99 mg/dL    Comment: Glucose reference range applies only to samples taken after fasting for at least 8 hours.  Glucose, capillary     Status: Abnormal   Collection Time: 08/22/20  7:48 PM  Result Value Ref Range   Glucose-Capillary 161 (H) 70 - 99 mg/dL    Comment: Glucose reference range applies only to samples taken after fasting for at least 8 hours.  Glucose, capillary     Status: Abnormal   Collection Time: 08/22/20  8:50 PM  Result Value Ref Range   Glucose-Capillary 137 (H) 70 - 99 mg/dL     Comment: Glucose reference range applies only to samples taken after fasting for at least 8 hours.  Glucose, capillary     Status: Abnormal   Collection Time: 08/22/20 10:15 PM  Result Value Ref Range   Glucose-Capillary 125 (H) 70 - 99 mg/dL    Comment: Glucose reference range applies only to samples taken after fasting for at least 8 hours.  Glucose, capillary     Status: Abnormal   Collection Time: 08/22/20 11:42 PM  Result Value Ref Range   Glucose-Capillary 131 (H) 70 - 99 mg/dL    Comment: Glucose reference range applies only to samples taken after fasting for at least 8 hours.  Glucose, capillary     Status: Abnormal   Collection Time: 08/23/20  1:26 AM  Result Value Ref Range   Glucose-Capillary 146 (H) 70 - 99 mg/dL    Comment: Glucose reference range applies only to samples taken after fasting for at least 8 hours.  Basic metabolic panel     Status: Abnormal   Collection Time: 08/23/20  2:43 AM  Result Value Ref Range   Sodium 134 (L) 135 - 145 mmol/L   Potassium 4.1 3.5 - 5.1  mmol/L   Chloride 107 98 - 111 mmol/L   CO2 22 22 - 32 mmol/L   Glucose, Bld 144 (H) 70 - 99 mg/dL    Comment: Glucose reference range applies only to samples taken after fasting for at least 8 hours.   BUN 16 8 - 23 mg/dL   Creatinine, Ser 1.05 0.61 - 1.24 mg/dL   Calcium 8.7 (L) 8.9 - 10.3 mg/dL   GFR, Estimated >60 >60 mL/min    Comment: (NOTE) Calculated using the CKD-EPI Creatinine Equation (2021)    Anion gap 5 5 - 15    Comment: Performed at [facility] [address] [place], [place] [address]  Glucose, capillary     Status: Abnormal   Collection Time: 08/23/20  2:46 AM  Result Value Ref Range   Glucose-Capillary 140 (H) 70 - 99 mg/dL    Comment: Glucose reference range applies only to samples taken after fasting for at least 8 hours.  Glucose, capillary     Status: Abnormal   Collection Time: 08/23/20  3:51 AM  Result Value Ref Range   Glucose-Capillary 141 (H) 70 - 99  mg/dL    Comment: Glucose reference range applies only to samples taken after fasting for at least 8 hours.  Glucose, capillary     Status: Abnormal   Collection Time: 08/23/20  5:32 AM  Result Value Ref Range   Glucose-Capillary 116 (H) 70 - 99 mg/dL    Comment: Glucose reference range applies only to samples taken after fasting for at least 8 hours.  Glucose, capillary     Status: Abnormal   Collection Time: 08/23/20  6:49 AM  Result Value Ref Range   Glucose-Capillary 154 (H) 70 - 99 mg/dL    Comment: Glucose reference range applies only to samples taken after fasting for at least 8 hours.  Glucose, capillary     Status: Abnormal   Collection Time: 08/23/20  7:48 AM  Result Value Ref Range   Glucose-Capillary 159 (H) 70 - 99 mg/dL    Comment: Glucose reference range applies only to samples taken after fasting for at least 8 hours.  Glucose, capillary     Status: Abnormal   Collection Time: 08/23/20  9:34 AM  Result Value Ref Range   Glucose-Capillary 149 (H) 70 - 99 mg/dL    Comment: Glucose reference range applies only to samples taken after fasting for at least 8 hours.  Glucose, capillary     Status: Abnormal   Collection Time: 08/23/20 10:52 AM  Result Value Ref Range   Glucose-Capillary 138 (H) 70 - 99 mg/dL    Comment: Glucose reference range applies only to samples taken after fasting for at least 8 hours.  Glucose, capillary     Status: Abnormal   Collection Time: 08/23/20 11:57 AM  Result Value Ref Range   Glucose-Capillary 144 (H) 70 - 99 mg/dL    Comment: Glucose reference range applies only to samples taken after fasting for at least 8 hours.  Glucose, capillary     Status: Abnormal   Collection Time: 08/23/20 12:57 PM  Result Value Ref Range   Glucose-Capillary 163 (H) 70 - 99 mg/dL    Comment: Glucose reference range applies only to samples taken after fasting for at least 8 hours.  Glucose, capillary     Status: Abnormal   Collection Time: 08/23/20  1:52 PM   Result Value Ref Range   Glucose-Capillary 148 (H) 70 - 99 mg/dL  Comment: Glucose reference range applies only to samples taken after fasting for at least 8 hours.  Glucose, capillary     Status: Abnormal   Collection Time: 08/23/20  3:41 PM  Result Value Ref Range   Glucose-Capillary 122 (H) 70 - 99 mg/dL    Comment: Glucose reference range applies only to samples taken after fasting for at least 8 hours.   Comment 1 Notify RN    Comment 2 Document in Chart   Glucose, capillary     Status: Abnormal   Collection Time: 08/23/20  8:13 PM  Result Value Ref Range   Glucose-Capillary 142 (H) 70 - 99 mg/dL    Comment: Glucose reference range applies only to samples taken after fasting for at least 8 hours.  Glucose, capillary     Status: Abnormal   Collection Time: 08/23/20  9:10 PM  Result Value Ref Range   Glucose-Capillary 180 (H) 70 - 99 mg/dL    Comment: Glucose reference range applies only to samples taken after fasting for at least 8 hours.  CBC     Status: Abnormal   Collection Time: 08/24/20  1:46 AM  Result Value Ref Range   WBC 11.8 (H) 4.0 - 10.5 K/uL   RBC 3.44 (L) 4.22 - 5.81 MIL/uL   Hemoglobin 10.2 (L) 13.0 - 17.0 g/dL   HCT 31.1 (L) 39.0 - 52.0 %   MCV 90.4 80.0 - 100.0 fL   MCH 29.7 26.0 - 34.0 pg   MCHC 32.8 30.0 - 36.0 g/dL   RDW 15.3 11.5 - 15.5 %   Platelets 150 150 - 400 K/uL   nRBC 0.0 0.0 - 0.2 %    Comment: Performed at [facility], [place] [address], [place], [place] [address]  Basic metabolic panel     Status: Abnormal   Collection Time: 08/24/20  1:46 AM  Result Value Ref Range   Sodium 134 (L) 135 - 145 mmol/L   Potassium 3.8 3.5 - 5.1 mmol/L   Chloride 103 98 - 111 mmol/L   CO2 24 22 - 32 mmol/L   Glucose, Bld 186 (H) 70 - 99 mg/dL    Comment: Glucose reference range applies only to samples taken after fasting for at least 8 hours.   BUN 18 8 - 23 mg/dL   Creatinine, Ser 1.14 0.61 - 1.24 mg/dL   Calcium 8.7 (L) 8.9 - 10.3 mg/dL   GFR,  Estimated >60 >60 mL/min    Comment: (NOTE) Calculated using the CKD-EPI Creatinine Equation (2021)    Anion gap 7 5 - 15    Comment: Performed at [facility] [address] [place], [place] [address]  Glucose, capillary     Status: Abnormal   Collection Time: 08/24/20  6:15 AM  Result Value Ref Range   Glucose-Capillary 228 (H) 70 - 99 mg/dL    Comment: Glucose reference range applies only to samples taken after fasting for at least 8 hours.  Glucose, capillary     Status: Abnormal   Collection Time: 08/24/20 11:05 AM  Result Value Ref Range   Glucose-Capillary 175 (H) 70 - 99 mg/dL    Comment: Glucose reference range applies only to samples taken after fasting for at least 8 hours.   DG Chest 2 View  Result Date: 08/24/2020 CLINICAL DATA:  Status post coronary artery bypass grafting EXAM: CHEST - 2 VIEW COMPARISON:  August 23, 2020 FINDINGS: Cordis and chest tubes have been removed. No pneumothorax. There is  a left pleural effusion with ill-defined airspace opacity in the left mid and lower lung regions. The right lung is clear. There is cardiomegaly. The pulmonary vascularity is normal. Patient is status post coronary artery bypass grafting. No adenopathy. No bone lesions. IMPRESSION: No pneumothorax. Small left pleural effusion with ill-defined airspace opacity in portions of the left mid and lower lung regions. These areas likely represent residual pulmonary edema and atelectasis. Pneumonia on the left cannot be excluded, however. Right lung is clear. Stable cardiomegaly. Electronically Signed   By: Lowella Grip III M.D.   On: 08/24/2020 08:06   DG Chest Port 1 View  Result Date: 08/23/2020 CLINICAL DATA:  Chest tube.  Open-heart surgery. EXAM: PORTABLE CHEST 1 VIEW COMPARISON:  Chest x-ray 08/22/2020. FINDINGS: Interim removal of Swan-Ganz catheter. Right IJ sheath in stable position. Left chest tubes in stable position. Prior CABG. Stable cardiomegaly. Persistent left base  atelectasis/consolidation and small left pleural effusion. Interim improvement in aeration from prior exam. No pneumothorax. IMPRESSION: 1. Interim removal of Swan-Ganz catheter. Right IJ sheath in stable position. Left chest tubes in stable position. No pneumothorax. 2. Prior CABG. Stable cardiomegaly. 3. Persistent left base atelectasis/consolidation and small left pleural effusion. Interim improvement in aeration from prior exam. Electronically Signed   By: Marcello Moores  Register   On: 08/23/2020 06:40    Review Of Systems Constitutional: No fever, chills, weight loss or gain. Eyes: No vision change, wears glasses. No discharge or pain. Ears: No hearing loss, No tinnitus. Respiratory: No asthma, COPD, pneumonias. No shortness of breath. No hemoptysis. Cardiovascular: No chest pain, palpitation, leg edema. Gastrointestinal: No nausea, vomiting, diarrhea, constipation. No GI bleed. No hepatitis. Genitourinary: No dysuria, hematuria, kidney stone. No incontinance. Neurological: No headache, stroke, seizures.  Psychiatry: No psych facility admission for anxiety, depression, suicide. No detox. Skin: No rash. Musculoskeletal: Positive joint pain, fibromyalgia. No neck pain, back pain. Lymphadenopathy: No lymphadenopathy. Hematology: No anemia or easy bruising.   Blood pressure 129/81, pulse 85, temperature (!) 97.5 F (36.4 C), temperature source Oral, resp. rate (!) 22, height 6\' 1"  (1.854 m), weight 112.4 kg, SpO2 98 %. Body mass index is 32.69 kg/m. General appearance: alert, cooperative, appears stated age and no distress Head: Normocephalic, atraumatic. Eyes: Brown eyes, Pale pink conjunctiva, corneas clear. PERRL, EOM's intact. Neck: No adenopathy, no carotid bruit, no JVD, supple, symmetrical, trachea midline and thyroid not enlarged. Resp: Clear to auscultation bilaterally. Midline scar of surgery. Cardio: Regular rate and rhythm, S1, S2 normal, II/VI systolic murmur, no click, rub or  gallop GI: Soft, non-tender; bowel sounds normal; no organomegaly. Extremities: No edema, cyanosis or clubbing. Skin: Warm and dry.  Neurologic: Alert and oriented X 3, normal strength. .  Assessment/Plan Multivessel CAD S/P 4 V CABG HTN HLD Type 2 DM Obesity  Continue medical treatment. F/U in 2 weeks my office or local cardiologist per patient  Time spent: Review of old records, Lab, x-rays, EKG, other cardiac tests, examination, discussion with patient/Nurse/Doctor over 70 minutes.  Birdie Riddle, MD  08/24/2020, 11:53 AM

## 2020-08-25 LAB — GLUCOSE, CAPILLARY
Glucose-Capillary: 162 mg/dL — ABNORMAL HIGH (ref 70–99)
Glucose-Capillary: 169 mg/dL — ABNORMAL HIGH (ref 70–99)
Glucose-Capillary: 255 mg/dL — ABNORMAL HIGH (ref 70–99)
Glucose-Capillary: 188 mg/dL — ABNORMAL HIGH (ref 70–99)

## 2020-08-25 MED ORDER — AMIODARONE HCL IN DEXTROSE 360-4.14 MG/200ML-% IV SOLN
60.0000 mg/h | INTRAVENOUS | Status: AC
  Administered 2020-08-25: 60 mg/h via INTRAVENOUS
  Filled 2020-08-25: qty 200

## 2020-08-25 MED ORDER — FUROSEMIDE 40 MG PO TABS
40.0000 mg | ORAL_TABLET | Freq: Every day | ORAL | Status: DC
  Administered 2020-08-25 – 2020-08-27 (×3): 40 mg via ORAL
  Filled 2020-08-25 (×3): qty 1

## 2020-08-25 MED ORDER — POTASSIUM CHLORIDE CRYS ER 20 MEQ PO TBCR
20.0000 meq | EXTENDED_RELEASE_TABLET | Freq: Every day | ORAL | Status: DC
  Administered 2020-08-25 – 2020-08-26 (×2): 20 meq via ORAL
  Filled 2020-08-25 (×2): qty 1

## 2020-08-25 MED ORDER — AMIODARONE HCL IN DEXTROSE 360-4.14 MG/200ML-% IV SOLN
30.0000 mg/h | INTRAVENOUS | Status: DC
  Administered 2020-08-25 – 2020-08-26 (×3): 30 mg/h via INTRAVENOUS
  Filled 2020-08-25 (×2): qty 200

## 2020-08-25 MED ORDER — AMIODARONE IV BOLUS ONLY 150 MG/100ML
150.0000 mg | Freq: Once | INTRAVENOUS | Status: AC
  Administered 2020-08-25: 150 mg via INTRAVENOUS
  Filled 2020-08-25: qty 100

## 2020-08-25 MED ORDER — AMIODARONE HCL 200 MG PO TABS
200.0000 mg | ORAL_TABLET | Freq: Two times a day (BID) | ORAL | Status: DC
  Administered 2020-08-25 – 2020-08-27 (×5): 200 mg via ORAL
  Filled 2020-08-25 (×5): qty 1

## 2020-08-25 NOTE — Progress Notes (Signed)
Mobility Specialist - Progress Note   08/25/20 1304  Mobility  Activity Ambulated in hall  Level of Assistance Minimal assist, patient does 75% or more  Assistive Device Front wheel walker  Distance Ambulated (ft) 360 ft  Mobility Response Tolerated well  Mobility performed by Mobility specialist  $Mobility charge 1 Mobility   Pre-mobility: 84 HR During mobility: 103 HR Post-mobility: 81 HR  Pt min assist to elevate trunk when sitting up on edge of bed. 2/4 DOE during ambulaiton, SpO2 remained >90% on RA throughout. Pt sitting up on edge of bed after walk.   Pricilla Handler Mobility Specialist Mobility Specialist Phone: [phone]

## 2020-08-25 NOTE — Progress Notes (Addendum)
      [address]       [place] [address]             [phone]      4 Days Post-Op Procedure(s) (LRB): CORONARY ARTERY BYPASS GRAFTING (CABG) TIMES FOUR USING RIGHT LEG GREATER SAPHENOUS VEIN HARVESTED ENDOSCOPICALLY, OPEN HARVESTED LEFT RADIAL ARTERY, AND LEFT INTERNAL MAMMARY ARTERY. (N/A) RADIAL ARTERY HARVEST (Left) TRANSESOPHAGEAL ECHOCARDIOGRAM (TEE) (N/A) INDOCYANINE GREEN FLUORESCENCE IMAGING (ICG) (N/A)   Subjective:  Sleepy, no new complaints.  + ambulation  + BM  Objective: Vital signs in last 24 hours: Temp:  [97.9 F (36.6 C)-98.2 F (36.8 C)] 98 F (36.7 C) (03/26 0735) Pulse Rate:  [75-96] 75 (03/26 0735) Cardiac Rhythm: Normal sinus rhythm;Bundle branch block (03/25 2005) Resp:  [14-21] 18 (03/26 0315) BP: (102-142)/(65-98) 142/84 (03/26 0735) SpO2:  [92 %-100 %] 98 % (03/26 0315) Weight:  [110.5 kg] 110.5 kg (03/26 0315)  Intake/Output from previous day: 03/25 0701 - 03/26 0700 In: 120 [P.O.:120] Out: 2250 [Urine:2250]  General appearance: alert, cooperative and no distress Heart: irregularly irregular rhythm Lungs: clear to auscultation bilaterally Abdomen: soft, non-tender; bowel sounds normal; no masses,  no organomegaly Extremities: edema trace Wound: clean and dry  Lab Results: Recent Labs    08/22/20 1526 08/24/20 0146  WBC 15.9* 11.8*  HGB 12.4* 10.2*  HCT 38.2* 31.1*  PLT 181 150   BMET:  Recent Labs    08/23/20 0243 08/24/20 0146  NA 134* 134*  K 4.1 3.8  CL 107 103  CO2 22 24  GLUCOSE 144* 186*  BUN 16 18  CREATININE 1.05 1.14  CALCIUM 8.7* 8.7*    PT/INR: No results for input(s): LABPROT, INR in the last 72 hours. ABG    Component Value Date/Time   PHART 7.356 08/21/2020 1932   HCO3 20.2 08/21/2020 1932   TCO2 21 (L) 08/21/2020 1932   ACIDBASEDEF 5.0 (H) 08/21/2020 1932   O2SAT 98.0 08/21/2020 1932   CBG (last 3)  Recent Labs    08/24/20 1606 08/24/20 2114 08/25/20 0611  GLUCAP 189*  210* 162*    Assessment/Plan: S/P Procedure(s) (LRB): CORONARY ARTERY BYPASS GRAFTING (CABG) TIMES FOUR USING RIGHT LEG GREATER SAPHENOUS VEIN HARVESTED ENDOSCOPICALLY, OPEN HARVESTED LEFT RADIAL ARTERY, AND LEFT INTERNAL MAMMARY ARTERY. (N/A) RADIAL ARTERY HARVEST (Left) TRANSESOPHAGEAL ECHOCARDIOGRAM (TEE) (N/A) INDOCYANINE GREEN FLUORESCENCE IMAGING (ICG) (N/A)  1. CV- A. Fib rate controlled this morning- will add Amiodarone 200 mg BID, continue Lopressor... will discuss need for anticoagulation with Dr. Kipp Brood 2. Pulm- no acute issues, off oxygen, continue IS 3. Renal- creatinine has been stable, K was normal, will get repeat BMET in AM prior to discharge 4. DM- sugars controlled, continue insulin regimen, will need close follow up with PCP 5. Dispo- patient in rate controlled A. Fib this morning, will start Amiodarone, discuss need for anticoagulation with Dr. Kipp Brood [facility] is 3,  recheck BMET in AM.. if remains stable and HR is controlled, for discharge likely in AM   LOS: 4 days    Ellwood Handler, PA-C 08/25/2020  Agree with above. Will chemically cardiovert in hopes of avoiding need for anticoagulation. Continue ambulation and pulmonary toilet.  Telissa Palmisano Bary Leriche

## 2020-08-26 LAB — GLUCOSE, CAPILLARY
Glucose-Capillary: 195 mg/dL — ABNORMAL HIGH (ref 70–99)
Glucose-Capillary: 179 mg/dL — ABNORMAL HIGH (ref 70–99)
Glucose-Capillary: 279 mg/dL — ABNORMAL HIGH (ref 70–99)
Glucose-Capillary: 189 mg/dL — ABNORMAL HIGH (ref 70–99)

## 2020-08-26 LAB — BASIC METABOLIC PANEL
Anion gap: 10 (ref 5–15)
BUN: 19 mg/dL (ref 8–23)
CO2: 23 mmol/L (ref 22–32)
Calcium: 8.4 mg/dL — ABNORMAL LOW (ref 8.9–10.3)
Chloride: 101 mmol/L (ref 98–111)
Creatinine, Ser: 0.98 mg/dL (ref 0.61–1.24)
GFR, Estimated: >60 mL/min (ref >60)
Glucose, Bld: 305 mg/dL — ABNORMAL HIGH (ref 70–99)
Potassium: 3.3 mmol/L — ABNORMAL LOW (ref 3.5–5.1)
Sodium: 134 mmol/L — ABNORMAL LOW (ref 135–145)

## 2020-08-26 MED ORDER — POTASSIUM CHLORIDE CRYS ER 20 MEQ PO TBCR
40.0000 meq | EXTENDED_RELEASE_TABLET | Freq: Every day | ORAL | Status: DC
  Administered 2020-08-26 – 2020-08-27 (×2): 40 meq via ORAL
  Filled 2020-08-26 (×2): qty 2

## 2020-08-26 NOTE — Progress Notes (Addendum)
      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 (N/A) RADIAL ARTERY HARVEST (Left) TRANSESOPHAGEAL ECHOCARDIOGRAM (TEE) (N/A) INDOCYANINE GREEN FLUORESCENCE IMAGING (ICG) (N/A)   Subjective:  No new complaints.  Overall doing well.  Hasn't yet moved his bowels, passing a lot of gas  Objective: Vital signs in last 24 hours: Temp:  [97.8 F (36.6 C)-98.4 F (36.9 C)] 97.8 F (36.6 C) (03/27 0811) Pulse Rate:  [75-89] 83 (03/27 0811) Cardiac Rhythm: Heart block (03/27 0912) Resp:  [19-20] 20 (03/27 0811) BP: (118-159)/(75-96) 118/75 (03/27 0811) SpO2:  [95 %-99 %] 95 % (03/27 0811) Weight:  [110.3 kg] 110.3 kg (03/27 0422)  Intake/Output from previous day: 03/26 0701 - 03/27 0700 In: 460.5 [I.V.:460.5] Out: 275 [Urine:275] Intake/Output this shift: Total I/O In: -  Out: 125 [Urine:125]  General appearance: alert, cooperative and no distress Heart: regular rate and rhythm Lungs: clear to auscultation bilaterally Abdomen: soft, non-tender; bowel sounds normal; no masses,  no organomegaly Extremities: edema trace Wound: clean and dry  Lab Results: Recent Labs    08/24/20 0146  WBC 11.8*  HGB 10.2*  HCT 31.1*  PLT 150   BMET:  Recent Labs    08/24/20 0146 08/26/20 0156  NA 134* 134*  K 3.8 3.3*  CL 103 101  CO2 24 23  GLUCOSE 186* 305*  BUN 18 19  CREATININE 1.14 0.98  CALCIUM 8.7* 8.4*    PT/INR: No results for input(s): LABPROT, INR in the last 72 hours. 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 (N/A) RADIAL ARTERY HARVEST (Left) TRANSESOPHAGEAL ECHOCARDIOGRAM (TEE) (N/A) INDOCYANINE GREEN FLUORESCENCE IMAGING (ICG) (N/A)  1. CV- continues to have some intermittent A. 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 Pulm- no acute issues, continue IS 3. Renal- creatinine WNL, K is low at 3.3, will increase potassium supplement 4. DM- sugars are elevated at times, continue current insulin regimen, will need to follow up with PCP as outpatient 5. Dispo- patient stable, continues to have brief A. Fib, now with 1st degree AV Block, transition to oral Amiodarone, continue Lopressor, K is low will increase supplementation, will repeat BMET in AM, will monitor patient, ideally want to avoid NOAC at discharge   LOS: 5 days    Ellwood Handler, PA-C 08/26/2020  Agree with above No longer in [place]

## 2020-08-26 NOTE — Progress Notes (Signed)
Mobility Specialist - Progress Note   08/26/20 1156  Mobility  Activity Ambulated in hall  Level of Assistance Standby assist, set-up cues, supervision of patient - no hands on  Assistive Device Front wheel walker  Distance Ambulated (ft) 360 ft (180 ft x 2)  Mobility Response Tolerated well  Mobility performed by Mobility specialist  $Mobility charge 1 Mobility   Pre-mobility: 76 HR During mobility: 92 HR Post-mobility: 77 HR  Pt required one standing rest break due to 2/4 dyspnea. Unable to get signal on pulse ox. Pt to recliner after walk.   Pricilla Handler Mobility Specialist Mobility Specialist Phone: [phone]

## 2020-08-27 ENCOUNTER — Other Ambulatory Visit: Payer: Self-pay | Admitting: Physician Assistant

## 2020-08-27 LAB — BASIC METABOLIC PANEL
Anion gap: 6 (ref 5–15)
BUN: 16 mg/dL (ref 8–23)
CO2: 26 mmol/L (ref 22–32)
Calcium: 9 mg/dL (ref 8.9–10.3)
Chloride: 106 mmol/L (ref 98–111)
Creatinine, Ser: 0.91 mg/dL (ref 0.61–1.24)
GFR, Estimated: >60 mL/min (ref >60)
Glucose, Bld: 145 mg/dL — ABNORMAL HIGH (ref 70–99)
Potassium: 3.7 mmol/L (ref 3.5–5.1)
Sodium: 138 mmol/L (ref 135–145)

## 2020-08-27 LAB — GLUCOSE, CAPILLARY
Glucose-Capillary: 178 mg/dL — ABNORMAL HIGH (ref 70–99)
Glucose-Capillary: 193 mg/dL — ABNORMAL HIGH (ref 70–99)

## 2020-08-27 MED ORDER — APIXABAN 5 MG PO TABS
5.0000 mg | ORAL_TABLET | Freq: Two times a day (BID) | ORAL | Status: DC
  Administered 2020-08-27: 5 mg via ORAL
  Filled 2020-08-27: qty 1

## 2020-08-27 MED ORDER — ISOSORBIDE DINITRATE 10 MG PO TABS
10.0000 mg | ORAL_TABLET | Freq: Three times a day (TID) | ORAL | 0 refills | Status: AC
Start: 2020-08-27 — End: ?

## 2020-08-27 MED ORDER — APIXABAN 5 MG PO TABS: 5.0000 mg | ORAL_TABLET | Freq: Two times a day (BID) | ORAL | 3 refills | Status: AC

## 2020-08-27 MED ORDER — POTASSIUM CHLORIDE CRYS ER 20 MEQ PO TBCR: 20.0000 meq | EXTENDED_RELEASE_TABLET | Freq: Every day | ORAL | 1 refills | Status: AC

## 2020-08-27 MED ORDER — TRAMADOL HCL 50 MG PO TABS: 50.0000 mg | ORAL_TABLET | ORAL | 0 refills | Status: AC | PRN

## 2020-08-27 MED ORDER — LACTULOSE 10 GM/15ML PO SOLN
20.0000 g | Freq: Once | ORAL | Status: AC
  Administered 2020-08-27: 20 g via ORAL
  Filled 2020-08-27: qty 30

## 2020-08-27 MED ORDER — AMIODARONE HCL 200 MG PO TABS: 200.0000 mg | ORAL_TABLET | Freq: Two times a day (BID) | ORAL | 1 refills | Status: AC

## 2020-08-27 MED FILL — ELIQUIS 5 MG TABLET: 5 | 30 days supply | Qty: 60 | Fill #0

## 2020-08-27 MED FILL — AMIODARONE HCL 200 MG TABS: 200 | 30 days supply | Qty: 60 | Fill #0

## 2020-08-27 MED FILL — POTASSIUM CHLORIDE 20meqER: 20 | 30 days supply | Qty: 30 | Fill #0

## 2020-08-27 MED FILL — traMADol HCL 50 MG TABS: 50 | 4 days supply | Qty: 30 | Fill #0

## 2020-08-27 MED FILL — ISOSORBIDE DN 10 MG TABLET: 10 | 30 days supply | Qty: 90 | Fill #0

## 2020-08-27 NOTE — Progress Notes (Addendum)
      [address]       [place] [address]             [phone]      6 Days Post-Op Procedure(s) (LRB): CORONARY ARTERY BYPASS GRAFTING (CABG) TIMES FOUR USING RIGHT LEG GREATER SAPHENOUS VEIN HARVESTED ENDOSCOPICALLY, OPEN HARVESTED LEFT RADIAL ARTERY, AND LEFT INTERNAL MAMMARY ARTERY. (N/A) RADIAL ARTERY HARVEST (Left) TRANSESOPHAGEAL ECHOCARDIOGRAM (TEE) (N/A) INDOCYANINE GREEN FLUORESCENCE IMAGING (ICG) (N/A)   Subjective:  Patient continues to feel well.  Hasn't moved his bowels, not passing much gas.  Denies N/V  Objective: Vital signs in last 24 hours: Temp:  [97.7 F (36.5 C)-98.4 F (36.9 C)] 98 F (36.7 C) (03/28 0506) Pulse Rate:  [72-85] 77 (03/28 0506) Cardiac Rhythm: Normal sinus rhythm (03/27 2035) Resp:  [20] 20 (03/28 0506) BP: (118-177)/(75-99) 148/99 (03/28 0506) SpO2:  [94 %-100 %] 96 % (03/28 0506) Weight:  [110.3 kg] 110.3 kg (03/28 0506)  Intake/Output from previous day: 03/27 0701 - 03/28 0700 In: -  Out: 800 [Urine:800]  General appearance: alert, cooperative and no distress Heart: regular rate and rhythm Lungs: clear to auscultation bilaterally Abdomen: soft, non-tender; bowel sounds normal; no masses,  no organomegaly Extremities: edema trace Wound: clean and dry  Lab Results: No results for input(s): WBC, HGB, HCT, PLT in the last 72 hours. BMET: Recent Labs    08/26/20 0156 08/27/20 0110  NA 134* 138  K 3.3* 3.7  CL 101 106  CO2 23 26  GLUCOSE 305* 145*  BUN 19 16  CREATININE 0.98 0.91  CALCIUM 8.4* 9.0    PT/INR: No results for input(s): LABPROT, INR in the last 72 hours. 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 (N/A) RADIAL ARTERY HARVEST (Left) TRANSESOPHAGEAL ECHOCARDIOGRAM (TEE) (N/A) INDOCYANINE GREEN FLUORESCENCE IMAGING (ICG) (N/A)  1. CV- NSR with 1sst degree block, however continues to have continued intermittent PAF that is rate controlled- on Amiodarone, Lopressor, Isordil for radial artery 2. Pulm- no acute issues, continue IS 3. Renal- creatinine remains stable, hypokalemia improved 4. GI- constipation, will give a dose of Lactulose today, if no response may need to try enema 5. Dispo- patient stable, lactulose for constipation today, will discuss need for blood thinner with Dr. Orvan Seen, patient is possible ready for d/c this afternoon   Addendum:  As discussed with Dr. Orvan Seen, will start Eliquis today   LOS: 6 days    Ellwood Handler, PA-C 08/27/2020

## 2020-08-27 NOTE — Progress Notes (Signed)
ANTICOAGULATION CONSULT NOTE - Initial Consult  Pharmacy Consult for Eliquis Indication: atrial fibrillation  No Known Allergies  Patient Measurements: Height: 6\' 1"  (185.4 cm) Weight: 110.3 kg (243 lb 3.2 oz) IBW/kg (Calculated) : 79.9  Vital Signs: Temp: 97.9 F (36.6 C) (03/28 0828) Temp Source: Oral (03/28 0828) BP: 139/87 (03/28 0828) Pulse Rate: 76 (03/28 0828)  Labs: Recent Labs    08/26/20 0156 08/27/20 0110  CREATININE 0.98 0.91    Estimated Creatinine Clearance: 99.8 mL/min (by C-G formula based on SCr of 0.91 mg/dL).   Medical History: Past Medical History:  Diagnosis Date  . 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 COVID-19 08/08/2020   on 08/08/20  . Depression   . Diabetes mellitus, type II ([place])    type 2  . Hearing loss    wears hearing aids  . Hyperlipidemia   . Hypertension   . Myocardial infarction ([place])   . Sleep apnea    does not use cpap    Assessment: 70 yo male s/p CABG with new onset atrial fibrillation.  Pharmacy asked to begin anticoagulation with Eliquis.  Goal of Therapy:  Monitor platelets by anticoagulation protocol: Yes   Plan:  Eliquis 5 mg po BID Will complete Eliquis education prior to discharge. Eliquis copay $47/mth.  [place] Crane, Roylene Reason, BCCP Clinical Pharmacist  08/27/2020 11:22 AM   [facility] pharmacy phone numbers are listed on [email or web address]

## 2020-08-27 NOTE — Progress Notes (Signed)
Mobility Specialist - Progress Note   08/27/20 1318  Mobility  Activity Refused mobility   Pt refused mobility, states he is going home today.   Pricilla Handler Mobility Specialist Mobility Specialist Phone: [phone]

## 2020-08-27 NOTE — Progress Notes (Signed)
Assisted pt getting to recliner for breakfast. Pt moving better, only min assist needed to stand. PVCs with mobility, no afib noted. Will f/u later to ambulate. [place] CES, ACSM 8:29 AM 08/27/2020

## 2020-08-27 NOTE — TOC Transition Note (Signed)
Transition of Care (TOC) - CM/SW Discharge Note Marvetta Gibbons RN, BSN Transitions of Care Unit 4E- RN Case Manager See Treatment Team for direct phone #    Patient Details  Name: Brian Carpenter MRN: [id number] Date of Birth: [birth year]  Transition of Care [facility]) CM/SW Contact:  Dawayne Patricia, RN Phone Number: 08/27/2020, 2:26 PM   Clinical Narrative:    Pt stable for transition home today with wife. Per wife she has borrowed RW for pt and has declined RW from [employer].  No further TOC needs noted.    Final next level of care: Home/Self Care Barriers to Discharge: No Barriers Identified   Patient Goals and CMS Choice Patient states their goals for this hospitalization and ongoing recovery are:: return home   Choice offered to / list presented to : NA  Discharge Placement               Home        Discharge Plan and Services   Discharge Planning Services: CM Consult Post Acute Care Choice: Durable Medical Equipment          DME Arranged: Walker rolling DME Agency: [employer] Date DME Agency Contacted: 08/24/20 Time DME Agency Contacted: [phone] Representative spoke with at DME Agency: [place] ([place]) Interventions     Readmission Risk Interventions Readmission Risk Prevention Plan 08/27/2020  Post Dischage Appt Complete  Medication Screening Complete  Transportation Screening Complete  Some recent data might be hidden

## 2020-08-27 NOTE — TOC Benefit Eligibility Note (Signed)
Patient Teacher, English as a foreign language completed.    The patient is currently admitted and upon discharge could be taking Eliquis 5 mg.  The current 30 day co-pay is, $47.00.   The patient is insured through [place], [place] Patient Advocate Specialist [place] Team Direct Number: [phone]  Fax: [phone]

## 2020-08-27 NOTE — TOC Benefit Eligibility Note (Signed)
Transition of Care [facility]) Benefit Eligibility Note    Patient Details  Name: Brian Carpenter MRN: [id number] Date of Birth: 1950/07/31   Medication/Dose: Eliquis 5mg . bid for 30 days  Covered?: Yes  Tier: 3 Drug  Prescription Coverage [facility]erred [facility]: [facility]  Spoke with Person/Company/Phone Number:: Sarina P. [employer] PH# [phone]  Co-Pay: $47.00  Prior Approval: No  Deductible:  (No Deductible)       Shelda Altes Phone Number: 08/27/2020, 12:24 PM

## 2020-08-27 NOTE — Care Management Important Message (Signed)
Important Message  Patient Details  Name: Brian Carpenter MRN: [id number] Date of Birth: 27-May-1951   Medicare Important Message Given:  Yes     Shelda Altes 08/27/2020, 8:56 AM

## 2020-08-27 NOTE — Progress Notes (Signed)
CARDIAC REHAB PHASE I   PRE:  Rate/Rhythm: 85 SR    BP: sitting 139/87    SaO2: 94 RA  MODE:  Ambulation: 400 ft   POST:  Rate/Rhythm: 115 ST with PAC/PVCs    BP: sitting 142/87     SaO2: 97 RA  Pt stood independently and walked with RW. Steady, less SOB today. Short rest at 1/2 way. Upon returning to recliner, pt in [place] but had short bursts of Afib (~3 beats). Practiced IS, 1250 mL. Encouraged more walking today. Reviewed ed. [place], ACSM 08/27/2020 9:41 AM

## 2020-08-27 NOTE — Discharge Instructions (Signed)
Discharge Instructions:  1. You may shower, please wash incisions daily with soap and water and keep dry.  If you wish to cover wounds with dressing you may do so but please keep clean and change daily.  No tub baths or swimming until incisions have completely healed.  If your incisions become red or develop any drainage please call our office at [phone]  2. No Driving until cleared by Dr. Otho Perl office and you are no longer using narcotic pain medications  3. Monitor your weight daily.. Please use the same scale and weigh at same time... If you gain 5-10 lbs in 48 hours with associated lower extremity swelling, please contact our office at [phone]  4. Fever of 101.5 for at least 24 hours with no source, please contact our office at [phone]  5. Activity- up as tolerated, please walk at least 3 times per day.  Avoid strenuous activity, no lifting, pushing, or pulling with your arms over 8-10 lbs for a minimum of 6 weeks  6. If any questions or concerns arise, please do not hesitate to contact our office at [phone]  Apixaban Tablets What is this medicine? APIXABAN (a PIX a ban) is an anticoagulant (blood thinner). It is used to lower the chance of stroke in people with a medical condition called atrial fibrillation. It is also used to treat or prevent blood clots in the lungs or in the veins. This medicine may be used for other purposes; ask your health care provider or pharmacist if you have questions. COMMON BRAND NAME(S): Eliquis What should I tell my health care provider before I take this medicine? They need to know if you have any of these conditions:  antiphospholipid antibody syndrome  bleeding disorders  bleeding in the brain  blood in your stools (black or tarry stools) or if you have blood in your vomit  history of blood clots  history of stomach bleeding  kidney disease  liver disease  mechanical heart valve  an unusual or allergic reaction to  apixaban, other medicines, foods, dyes, or preservatives  pregnant or trying to get pregnant  breast-feeding How should I use this medicine? Take this medicine by mouth with a glass of water. Follow the directions on the prescription label. You can take it with or without food. If it upsets your stomach, take it with food. Take your medicine at regular intervals. Do not take it more often than directed. Do not stop taking except on your doctor's advice. Stopping this medicine may increase your risk of a blood clot. Be sure to refill your prescription before you run out of medicine. Talk to your pediatrician regarding the use of this medicine in children. Special care may be needed. Overdosage: If you think you have taken too much of this medicine contact a poison control center or emergency room at once. NOTE: This medicine is only for you. Do not share this medicine with others. What if I miss a dose? If you miss a dose, take it as soon as you can. If it is almost time for your next dose, take only that dose. Do not take double or extra doses. What may interact with this medicine? This medicine may interact with the following:  aspirin and aspirin-like medicines  certain medicines for fungal infections like ketoconazole and itraconazole  certain medicines for seizures like carbamazepine and phenytoin  certain medicines that treat or prevent blood clots like warfarin, enoxaparin, and dalteparin  clarithromycin  NSAIDs, medicines for pain and inflammation,  like ibuprofen or naproxen  rifampin  ritonavir  St. John's wort This list may not describe all possible interactions. Give your health care provider a list of all the medicines, herbs, non-prescription drugs, or dietary supplements you use. Also tell them if you smoke, drink alcohol, or use illegal drugs. Some items may interact with your medicine. What should I watch for while using this medicine? Visit your healthcare  professional for regular checks on your progress. You may need blood work done while you are taking this medicine. Your condition will be monitored carefully while you are receiving this medicine. It is important not to miss any appointments. Avoid sports and activities that might cause injury while you are using this medicine. Severe falls or injuries can cause unseen bleeding. Be careful when using sharp tools or knives. Consider using an Copy. Take special care brushing or flossing your teeth. Report any injuries, bruising, or red spots on the skin to your healthcare professional. If you are going to need surgery or other procedure, tell your healthcare professional that you are taking this medicine. Wear a medical ID bracelet or chain. Carry a card that describes your disease and details of your medicine and dosage times. What side effects may I notice from receiving this medicine? Side effects that you should report to your doctor or health care professional as soon as possible:  allergic reactions like skin rash, itching or hives, swelling of the face, lips, or tongue  signs and symptoms of bleeding such as bloody or black, tarry stools; red or dark-brown urine; spitting up blood or brown material that looks like coffee grounds; red spots on the skin; unusual bruising or bleeding from the eye, gums, or nose  signs and symptoms of a blood clot such as chest pain; shortness of breath; pain, swelling, or warmth in the leg  signs and symptoms of a stroke such as changes in vision; confusion; trouble speaking or understanding; severe headaches; sudden numbness or weakness of the face, arm or leg; trouble walking; dizziness; loss of coordination This list may not describe all possible side effects. Call your doctor for medical advice about side effects. You may report side effects to FDA at 1-800-FDA-1088. Where should I keep my medicine? Keep out of the reach of children. Store at room  temperature between 20 and 25 degrees C (68 and 77 degrees F). Throw away any unused medicine after the expiration date. NOTE: This sheet is a summary. It may not cover all possible information. If you have questions about this medicine, talk to your doctor, pharmacist, or health care provider.  2021 Elsevier/Gold Standard (2020-03-28 16:54:11)  Information on my medicine - ELIQUIS (apixaban)  This medication education was reviewed with me or my healthcare representative as part of my discharge preparation.  The pharmacist that spoke with me during my hospital stay was:  Pat Patrick, [facility]  Why was Eliquis prescribed for you? Eliquis was prescribed for you to reduce the risk of a blood clot forming that can cause a stroke if you have a medical condition called atrial fibrillation (a type of irregular heartbeat).  What do You need to know about Eliquis ? Take your Eliquis TWICE DAILY - one tablet in the morning and one tablet in the evening with or without food. If you have difficulty swallowing the tablet whole please discuss with your pharmacist how to take the medication safely.  Take Eliquis exactly as prescribed by your doctor and DO NOT stop taking Eliquis  without talking to the doctor who prescribed the medication.  Stopping may increase your risk of developing a stroke.  Refill your prescription before you run out.  After discharge, you should have regular check-up appointments with your healthcare provider that is prescribing your Eliquis.  In the future your dose may need to be changed if your kidney function or weight changes by a significant amount or as you get older.  What do you do if you miss a dose? If you miss a dose, take it as soon as you remember on the same day and resume taking twice daily.  Do not take more than one dose of ELIQUIS at the same time to make up a missed dose.  Important Safety Information A possible side effect of Eliquis is bleeding. You  should call your healthcare provider right away if you experience any of the following: ? Bleeding from an injury or your nose that does not stop. ? Unusual colored urine (red or dark brown) or unusual colored stools (red or black). ? Unusual bruising for unknown reasons. ? A serious fall or if you hit your head (even if there is no bleeding).  Some medicines may interact with Eliquis and might increase your risk of bleeding or clotting while on Eliquis. To help avoid this, consult your healthcare provider or pharmacist prior to using any new prescription or non-prescription medications, including herbals, vitamins, non-steroidal anti-inflammatory drugs (NSAIDs) and supplements.  This website has more information on Eliquis (apixaban): http://www.eliquis.com/eliquis/home

## 2020-08-29 ENCOUNTER — Telehealth (HOSPITAL_COMMUNITY): Payer: Self-pay

## 2020-08-29 NOTE — Telephone Encounter (Signed)
Per phase 1, fax cardiac rehab referral to [facility] cardiac rehab.

## 2020-09-06 ENCOUNTER — Ambulatory Visit: Payer: Self-pay | Admitting: Cardiothoracic Surgery

## 2020-09-06 ENCOUNTER — Other Ambulatory Visit: Payer: Self-pay

## 2020-09-06 ENCOUNTER — Encounter: Payer: Self-pay | Admitting: Cardiothoracic Surgery

## 2020-09-06 ENCOUNTER — Other Ambulatory Visit: Payer: Self-pay | Admitting: Cardiothoracic Surgery

## 2020-09-06 ENCOUNTER — Ambulatory Visit
Admission: RE | Admit: 2020-09-06 | Discharge: 2020-09-06 | Disposition: A | Discharge status: Home / Self Care | Payer: Medicare PPO | Source: Ambulatory Visit | Attending: Cardiothoracic Surgery | Admitting: Cardiothoracic Surgery

## 2020-09-06 ENCOUNTER — Ambulatory Visit (INDEPENDENT_AMBULATORY_CARE_PROVIDER_SITE_OTHER): Payer: Self-pay | Admitting: Cardiothoracic Surgery

## 2020-09-06 VITALS — BP 113/66 | HR 74 | Temp 97.5°F | Resp 20 | Ht 73.0 in | Wt 243.0 lb

## 2020-09-06 DIAGNOSIS — I2510 Atherosclerotic heart disease of native coronary artery without angina pectoris: Secondary | ICD-10-CM

## 2020-09-06 DIAGNOSIS — Z951 Presence of aortocoronary bypass graft: Secondary | ICD-10-CM

## 2020-09-12 ENCOUNTER — Other Ambulatory Visit: Payer: Self-pay | Admitting: "Endocrinology

## 2020-09-26 NOTE — Progress Notes (Signed)
[address]       [place] [address]             [phone]     CARDIOTHORACIC SURGERY OFFICE NOTE  Referring Provider is Orpah Greek, MD Primary Cardiologist is No primary care provider on file. PCP is Emelda Fear, DO   HPI:  70 year old man presents for initial outpatient visit status post recent CABG.  His surgery scheduling was complicated by incidentally discovered COVID status.  He is also [employer] and wanted to avoid the use of blood or blood products.  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  Patient did well after surgery and was discharged a few days later.  He now returns doing well.  He has a good appetite and his energy level is improving.  He denies chest pain shortness of breath or fevers   Current Outpatient Medications  Medication Sig Dispense Refill  . acetaminophen (TYLENOL) 325 MG tablet Take 2 tablets (650 mg total) by mouth every 4 (four) hours as needed for headache or mild pain.    Marland Kitchen amiodarone (PACERONE) 200 MG tablet Take 1 tablet (200 mg total) by mouth 2 (two) times daily. 60 tablet 1  . amiodarone (PACERONE) 200 MG tablet TAKE 1 TABLET (200 MG TOTAL) BY MOUTH TWO TIMES DAILY. 60 tablet 1  . amLODipine (NORVASC) 10 MG tablet TAKE 1 TABLET (10 MG TOTAL) BY MOUTH DAILY. 30 tablet 0  . apixaban (ELIQUIS) 5 MG TABS tablet Take 1 tablet (5 mg total) by mouth 2 (two) times daily. 60 tablet 3  . apixaban (ELIQUIS) 5 MG TABS tablet TAKE 1 TABLET (5 MG TOTAL) BY MOUTH TWO TIMES DAILY. 60 tablet 3  . aspirin EC 81 MG tablet Take 81 mg by mouth daily. Swallow whole.    Marland Kitchen atorvastatin (LIPITOR) 80 MG tablet Take 1 tablet (80 mg total) by mouth daily. 30 tablet 0  . atorvastatin (LIPITOR) 80 MG tablet TAKE 1 TABLET (80 MG TOTAL) BY MOUTH DAILY. 30 tablet 0  . b complex vitamins capsule Take 1 capsule by mouth daily.    . carvedilol (COREG) 3.125 MG tablet Take 1 tablet (3.125 mg total) by mouth 2 (two) times daily with a meal. 60 tablet 0   . carvedilol (COREG) 3.125 MG tablet TAKE 1 TABLET (3.125 MG TOTAL) BY MOUTH TWO TIMES DAILY WITH A MEAL. 60 tablet 0  . cetirizine (ZYRTEC) 10 MG tablet Take 10 mg by mouth daily.    . Cholecalciferol (VITAMIN D3) 50 MCG (2000 UT) TABS Take 2,000 Units by mouth daily.    . empagliflozin (JARDIANCE) 10 MG TABS tablet Take 1 tablet (10 mg total) by mouth daily. 30 tablet 0  . empagliflozin (JARDIANCE) 10 MG TABS tablet TAKE 1 TABLET (10 MG TOTAL) BY MOUTH DAILY. 30 tablet 0  . enoxaparin (LOVENOX) 100 MG/ML injection INJECT 1 ML (100 MG TOTAL) INTO THE SKIN EVERY TWELVE HOURS. **FOUR DAYS** 20 mL 0  . furosemide (LASIX) 40 MG tablet Take 40 mg by mouth daily.    Marland Kitchen glucose blood (RELION GLUCOSE TEST STRIPS) test strip Use as instructed 100 each 2  . insulin NPH-regular Human (70-30) 100 UNIT/ML injection Inject 20 Units into the skin 2 (two) times daily. (Patient taking differently: Inject 5-20 Units into the skin 2 (two) times daily. Per sliding scale) 30 mL 1  . isosorbide dinitrate (ISORDIL) 10 MG tablet Take 1 tablet (10 mg total) by mouth 3 (three) times daily.  90 tablet 0  . isosorbide dinitrate (ISORDIL) 10 MG tablet TAKE 1 TABLET (10 MG TOTAL) BY MOUTH THREE TIMES DAILY. 90 tablet 0  . lisinopril (ZESTRIL) 40 MG tablet Take 40 mg by mouth daily.    . Multiple Vitamins-Minerals (MENS MULTIVITAMIN PO) Take 1 tablet by mouth daily.    . nitroGLYCERIN (NITROSTAT) 0.4 MG SL tablet Place 0.4 mg under the tongue every 5 (five) minutes as needed for chest pain.    . potassium chloride SA (KLOR-CON) 20 MEQ tablet Take 1 tablet (20 mEq total) by mouth daily. Use daily on days you are taking Lasix 30 tablet 1  . potassium chloride SA (KLOR-CON) 20 MEQ tablet TAKE 1 TABLET (20 MEQ TOTAL) BY MOUTH DAILY. USE DAILY ON DAYS YOU ARE TAKING LASIX 30 tablet 1  . sertraline (ZOLOFT) 100 MG tablet Take 100 mg by mouth daily.    Marland Kitchen spironolactone (ALDACTONE) 25 MG tablet TAKE 1/2 TABLET (12.5 MG TOTAL) BY MOUTH  TWO TIMES DAILY. 30 tablet 0  . traMADol (ULTRAM) 50 MG tablet Take 1-2 tablets (50-100 mg total) by mouth every 4 (four) hours as needed for moderate pain. 30 tablet 0  . traMADol (ULTRAM) 50 MG tablet TAKE 1-2 TABLETS (50-100 MG TOTAL) BY MOUTH EVERY FOUR HOURS AS NEEDED FOR MODERATE PAIN. 30 tablet 0  . traZODone (DESYREL) 50 MG tablet Take 50 mg by mouth at bedtime.    . trolamine salicylate (ASPERCREME) 10 % cream Apply 1 application topically as needed for muscle pain.     No current facility-administered medications for this visit.      Physical Exam:   BP 113/66 (BP Location: Right Arm, Patient Position: Sitting, Cuff Size: Large)   Pulse 74   Temp (!) 97.5 F (36.4 C) (Skin)   Resp 20   Ht 6\' 1"  (1.854 m)   Wt 110.2 kg   SpO2 93% Comment: RA  BMI 32.06 kg/m   General:  Well-appearing no acute distress  Chest:   Clear to auscultation bilaterally  CV:   Regular rate and rhythm  Incisions:  Healing well  Abdomen:  Soft nontender  Extremities:  Minimal edema  Diagnostic Tests:  I have personally reviewed his available imaging studies including chest x-ray from 09/06/2020 which demonstrates clear lung fields and stable mediastinal structures   Impression:  Doing well after CABG  Plan:  Follow-up with thoracic surgery as needed Follow-up with cardiology in [place], [place]  I spent in excess of 20 minutes during the conduct of this office consultation and >50% of this time involved direct face-to-face encounter with the patient for counseling and/or coordination of their care.  Level 2                 10 minutes Level 3                 15 minutes Level 4                 25 minutes Level 5                 40 minutes  B.  Murvin Natal, MD 09/26/2020 10:31 AM

## 2022-02-15 IMAGING — CT CT CHEST W/O CM
2 of 4 series · 15 of 36 positions shown, 18 images · non-contrast
Comparison: None.

CLINICAL DATA: Thoracic aortic disease, preoperative planning CABG

EXAM:
CT CHEST WITHOUT CONTRAST
TECHNIQUE: Multidetector CT imaging of the chest was performed following the
standard protocol without IV contrast.

[Series 4: thorax 2.0 · axial · 0.77mm/px · z∈[+1325,+1597]mm · 12 of 153 slices shown, 15 images]
[im 9/153  mediastinal]
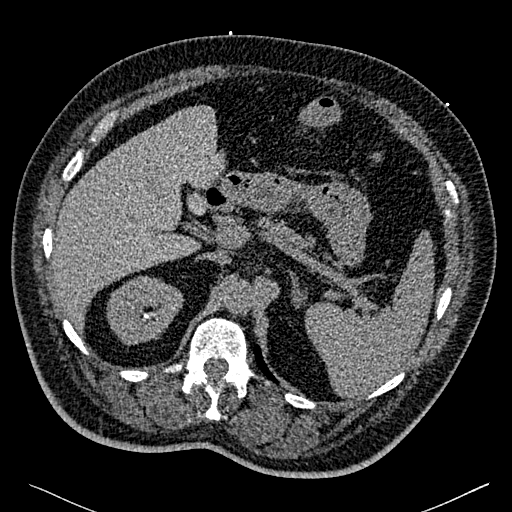
[im 9/153  lung]
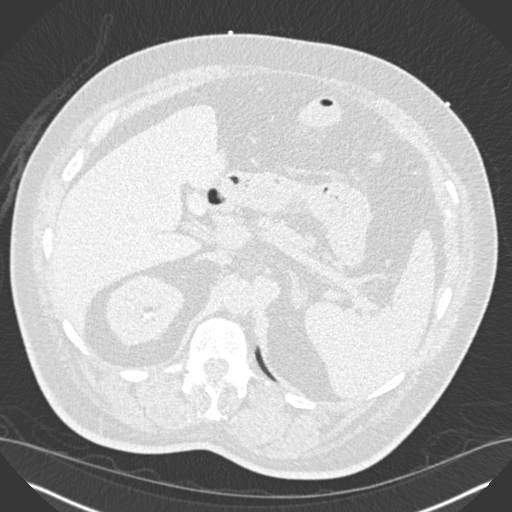
[im 25/153  lung]
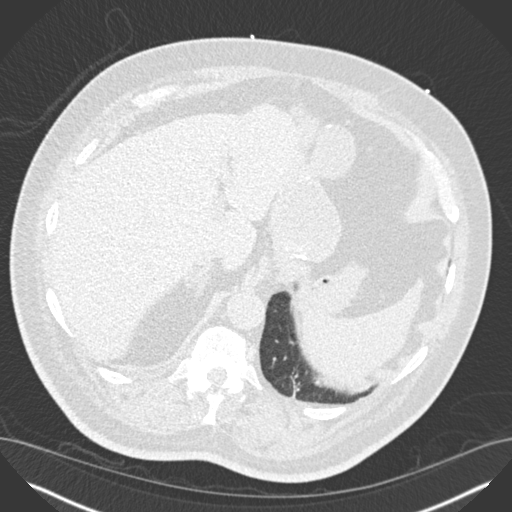
[im 33/153  lung]
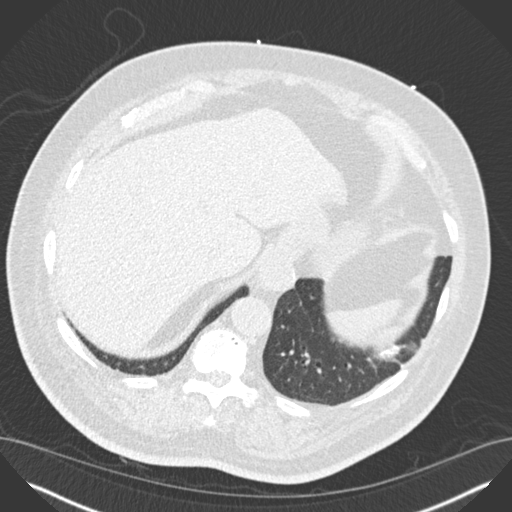
[im 49/153  lung]
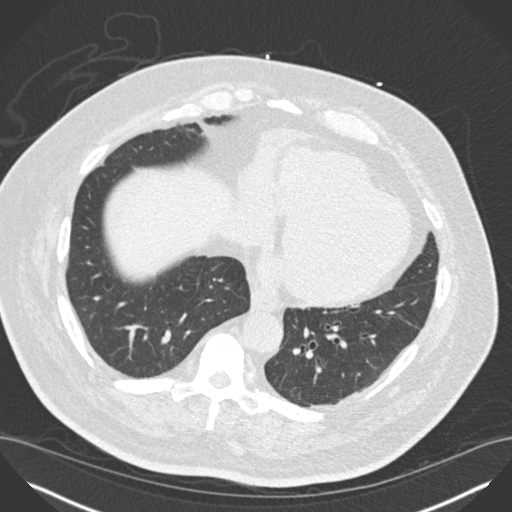
[im 57/153  mediastinal]
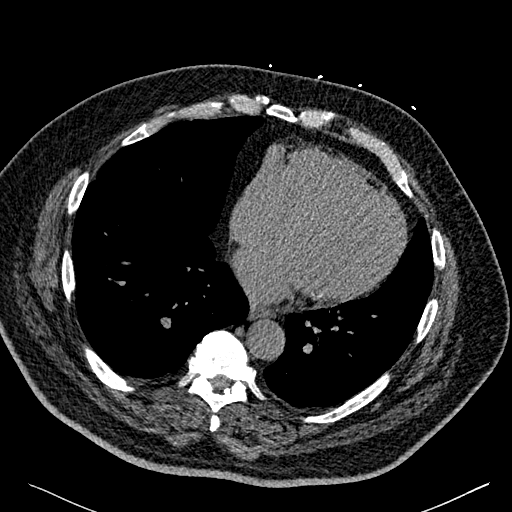
[im 57/153  lung]
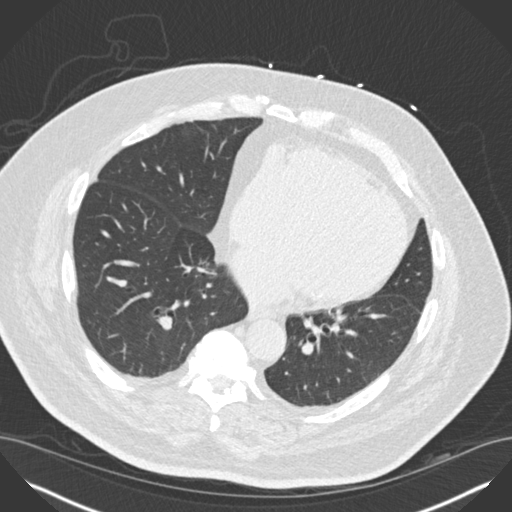
[im 73/153  lung]
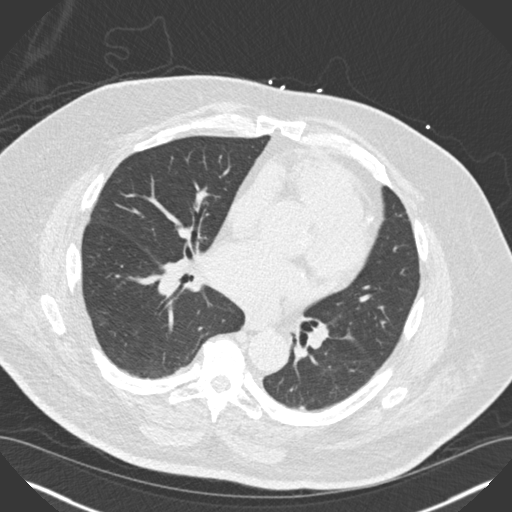
[im 81/153  lung]
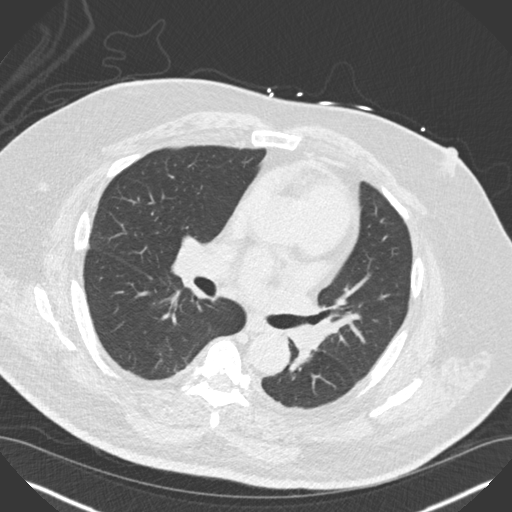
[im 97/153  lung]
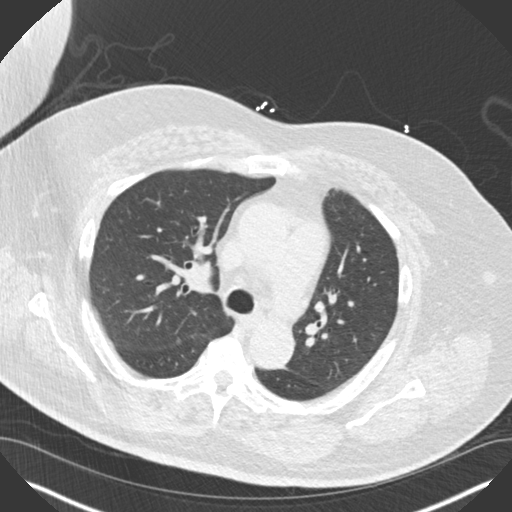
[im 105/153  mediastinal]
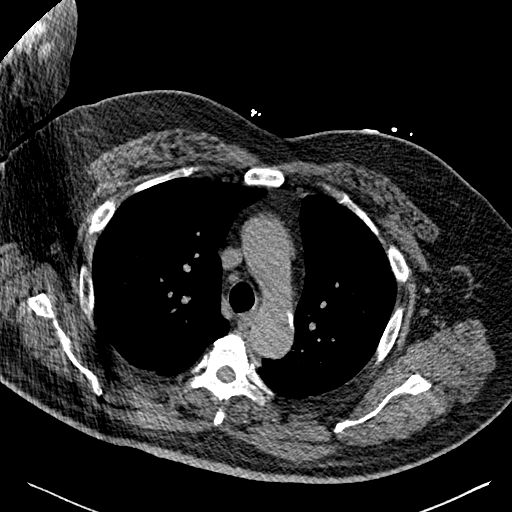
[im 105/153  lung]
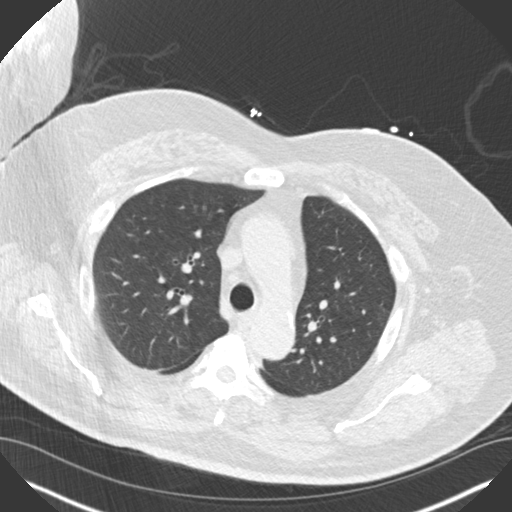
[im 121/153  lung]
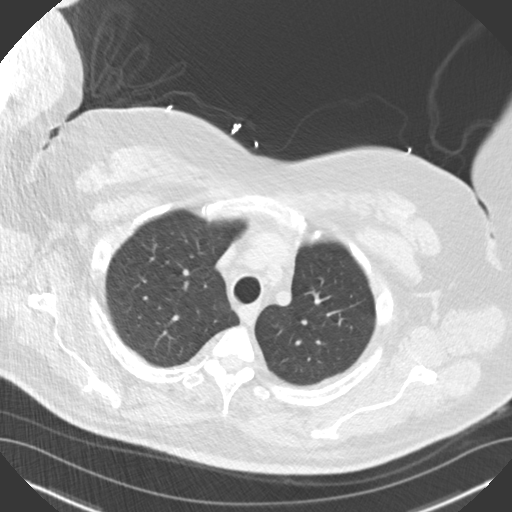
[im 129/153  lung]
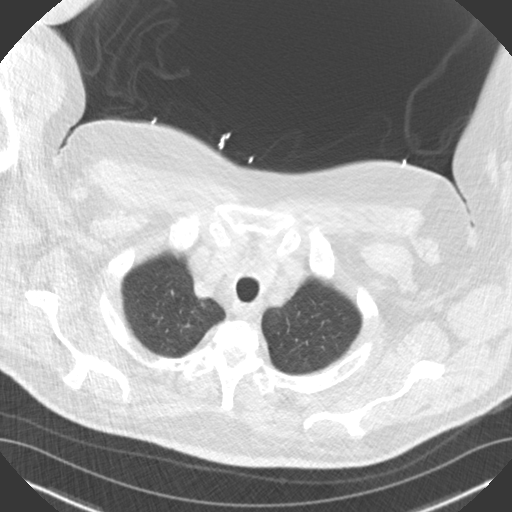
[im 145/153  lung]
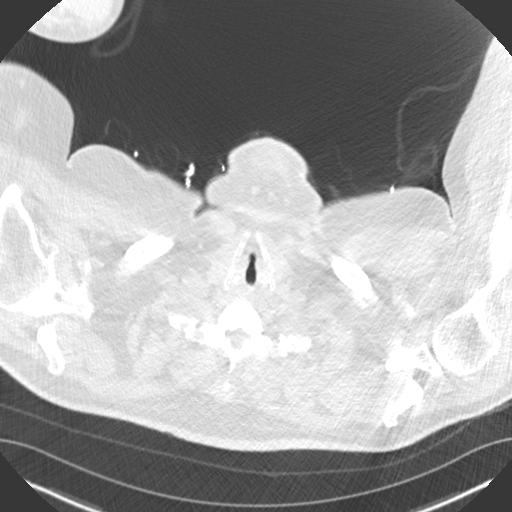

[Series 7: coronal · coronal · 0.59mm/px · 3 of 103 slices shown]
[im 21/103  lung]
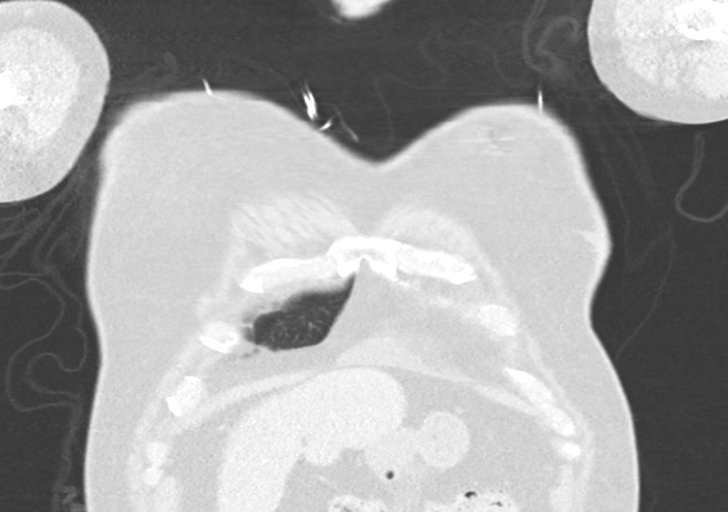
[im 41/103  lung]
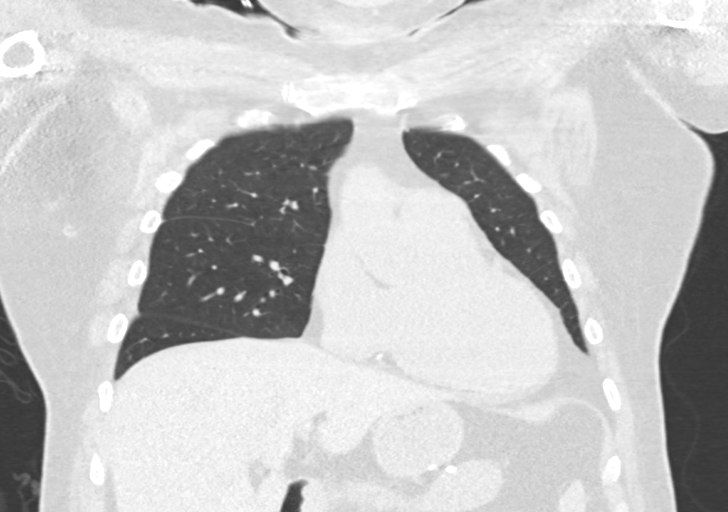
[im 62/103  lung]
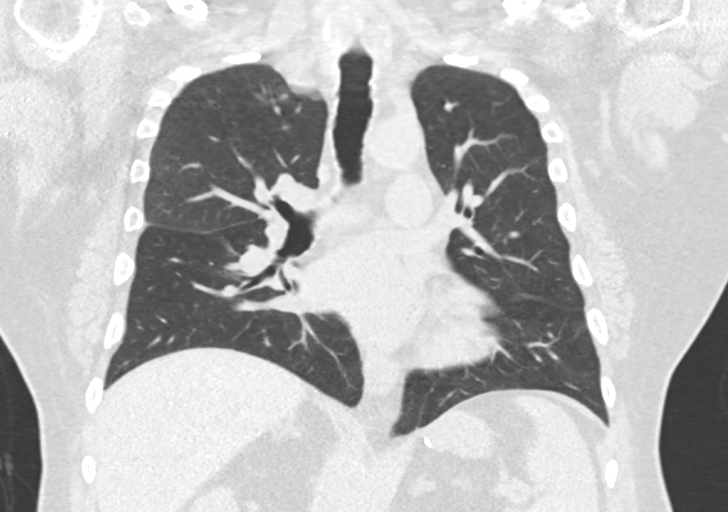

[15 of 36 positions shown; findings below may reference images not displayed]

FINDINGS: Cardiovascular: Scattered aortic atherosclerosis. Incidental note of
bovine type 2 vessel branching pattern of the aortic arch. Normal
heart size. Three-vessel coronary artery calcifications and/or
stents. No pericardial effusion. The left brachiocephalic vein lies
1.8 cm posterior to the manubrium. The tubular ascending aorta lies
2.1 cm posterior to the sternal body. The right ventricle lies
cm posterior to the sternal body.

Mediastinum/Nodes: No enlarged mediastinal, hilar, or axillary lymph
nodes. Thyroid gland, trachea, and esophagus demonstrate no
significant findings.

Lungs/Pleura: Minimal scarring of the lung bases. No pleural
effusion or pneumothorax.

Upper Abdomen: No acute abnormality. Partially imaged postoperative
findings of Roux type gastric bypass in the included upper abdomen.
Nonobstructive superior pole calculus of the right kidney.

Musculoskeletal: No chest wall mass or suspicious bone lesions
identified.
IMPRESSION: 1. The left brachiocephalic vein lies 1.8 cm posterior to the
manubrium. The tubular ascending aorta lies 2.1 cm posterior to the
sternal body. The right ventricle lies 0.8 cm posterior to the
sternal body.
2. Scattered aortic atherosclerosis.
3. Coronary artery disease.
4. Nonobstructive right nephrolithiasis.

Aortic Atherosclerosis ([id number]-1OY.Y).

## 2023-12-01 DEATH — deceased
